# Patient Record
Sex: Male | Born: 1950 | Race: White | Hispanic: No | Marital: Married | Smoking: Never smoker
Health system: Southern US, Community
[De-identification: ages and names within clinical notes are randomized; demographics above are authoritative.]

## PROBLEM LIST (undated history)

## (undated) DIAGNOSIS — Z6841 Body Mass Index (BMI) 40.0 and over, adult: Secondary | ICD-10-CM

## (undated) DIAGNOSIS — I10 Essential (primary) hypertension: Secondary | ICD-10-CM

## (undated) DIAGNOSIS — I9789 Other postprocedural complications and disorders of the circulatory system, not elsewhere classified: Secondary | ICD-10-CM

## (undated) DIAGNOSIS — Z951 Presence of aortocoronary bypass graft: Secondary | ICD-10-CM

## (undated) DIAGNOSIS — E785 Hyperlipidemia, unspecified: Secondary | ICD-10-CM

## (undated) DIAGNOSIS — E119 Type 2 diabetes mellitus without complications: Secondary | ICD-10-CM

## (undated) DIAGNOSIS — I2510 Atherosclerotic heart disease of native coronary artery without angina pectoris: Secondary | ICD-10-CM

## (undated) DIAGNOSIS — I4891 Unspecified atrial fibrillation: Secondary | ICD-10-CM

## (undated) HISTORY — DX: Essential (primary) hypertension: I10

## (undated) HISTORY — DX: Body Mass Index (BMI) 40.0 and over, adult: Z684

## (undated) HISTORY — DX: Other postprocedural complications and disorders of the circulatory system, not elsewhere classified: I97.89

## (undated) HISTORY — DX: Atherosclerotic heart disease of native coronary artery without angina pectoris: I25.10

## (undated) HISTORY — DX: Unspecified atrial fibrillation: I48.91

## (undated) HISTORY — DX: Hyperlipidemia, unspecified: E78.5

## (undated) HISTORY — DX: Type 2 diabetes mellitus without complications: E11.9

## (undated) HISTORY — DX: Presence of aortocoronary bypass graft: Z95.1

## (undated) HISTORY — DX: Morbid (severe) obesity due to excess calories: E66.01

---

## 2007-06-13 ENCOUNTER — Emergency Department (HOSPITAL_COMMUNITY): Admission: EM | Admit: 2007-06-13 | Discharge: 2007-06-14 | Payer: Self-pay | Admitting: Emergency Medicine

## 2008-08-02 ENCOUNTER — Encounter: Admission: RE | Admit: 2008-08-02 | Discharge: 2008-09-21 | Payer: Self-pay | Admitting: Pain Medicine

## 2008-11-03 ENCOUNTER — Ambulatory Visit: Payer: Self-pay | Admitting: Vascular Surgery

## 2008-12-23 ENCOUNTER — Ambulatory Visit: Payer: Self-pay | Admitting: Vascular Surgery

## 2009-05-10 ENCOUNTER — Ambulatory Visit: Payer: Self-pay | Admitting: Vascular Surgery

## 2010-04-03 ENCOUNTER — Ambulatory Visit: Payer: Self-pay | Admitting: Vascular Surgery

## 2010-06-15 DIAGNOSIS — I2510 Atherosclerotic heart disease of native coronary artery without angina pectoris: Secondary | ICD-10-CM | POA: Insufficient documentation

## 2010-06-15 HISTORY — PX: CARDIAC CATHETERIZATION: SHX172

## 2010-06-26 ENCOUNTER — Ambulatory Visit
Admission: RE | Admit: 2010-06-26 | Discharge: 2010-06-26 | Disposition: A | Discharge status: Home / Self Care | Payer: BC Managed Care – PPO | Source: Ambulatory Visit | Attending: Cardiology | Admitting: Cardiology

## 2010-06-26 ENCOUNTER — Other Ambulatory Visit: Payer: Self-pay | Admitting: Cardiology

## 2010-06-26 DIAGNOSIS — I2510 Atherosclerotic heart disease of native coronary artery without angina pectoris: Secondary | ICD-10-CM

## 2010-06-26 HISTORY — DX: Atherosclerotic heart disease of native coronary artery without angina pectoris: I25.10

## 2010-06-30 ENCOUNTER — Ambulatory Visit (HOSPITAL_COMMUNITY)
Admission: RE | Admit: 2010-06-30 | Discharge: 2010-06-30 | Disposition: A | Discharge status: Home / Self Care | Payer: BC Managed Care – PPO | Source: Ambulatory Visit | Attending: Cardiology | Admitting: Cardiology

## 2010-06-30 ENCOUNTER — Other Ambulatory Visit: Payer: Self-pay | Admitting: Cardiothoracic Surgery

## 2010-06-30 DIAGNOSIS — E785 Hyperlipidemia, unspecified: Secondary | ICD-10-CM | POA: Insufficient documentation

## 2010-06-30 DIAGNOSIS — E119 Type 2 diabetes mellitus without complications: Secondary | ICD-10-CM | POA: Insufficient documentation

## 2010-06-30 DIAGNOSIS — I2510 Atherosclerotic heart disease of native coronary artery without angina pectoris: Secondary | ICD-10-CM

## 2010-06-30 DIAGNOSIS — E669 Obesity, unspecified: Secondary | ICD-10-CM | POA: Insufficient documentation

## 2010-06-30 DIAGNOSIS — Z01810 Encounter for preprocedural cardiovascular examination: Secondary | ICD-10-CM

## 2010-06-30 DIAGNOSIS — I10 Essential (primary) hypertension: Secondary | ICD-10-CM | POA: Insufficient documentation

## 2010-06-30 LAB — COMPREHENSIVE METABOLIC PANEL
ALT: 19 U/L (ref 0–53)
Alkaline Phosphatase: 57 U/L (ref 39–117)
Chloride: 110 mEq/L (ref 96–112)
Glucose, Bld: 144 mg/dL — ABNORMAL HIGH (ref 70–99)
Potassium: 3.9 mEq/L (ref 3.5–5.1)
Sodium: 139 mEq/L (ref 135–145)
Total Protein: 6.1 g/dL (ref 6.0–8.3)

## 2010-06-30 LAB — DIFFERENTIAL
Eosinophils Absolute: 0.2 10*3/uL (ref 0.0–0.7)
Lymphocytes Relative: 39 % (ref 12–46)
Lymphs Abs: 3.2 10*3/uL (ref 0.7–4.0)
Neutro Abs: 4 10*3/uL (ref 1.7–7.7)
Neutrophils Relative %: 48 % (ref 43–77)

## 2010-06-30 LAB — CBC
HCT: 37.3 % — ABNORMAL LOW (ref 39.0–52.0)
Hemoglobin: 12.6 g/dL — ABNORMAL LOW (ref 13.0–17.0)
MCV: 82.7 fL (ref 78.0–100.0)
Platelets: 209 10*3/uL (ref 150–400)
RBC: 4.51 MIL/uL (ref 4.22–5.81)
WBC: 8.4 10*3/uL (ref 4.0–10.5)

## 2010-06-30 LAB — LIPID PANEL
Cholesterol: 134 mg/dL (ref 0–200)
Total CHOL/HDL Ratio: 3.8 RATIO
VLDL: 11 mg/dL (ref 0–40)

## 2010-06-30 LAB — GLUCOSE, CAPILLARY: Glucose-Capillary: 129 mg/dL — ABNORMAL HIGH (ref 70–99)
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
## 2010-07-03 NOTE — Procedures (Signed)
NAME:  Carl Steele, Carl Steele NO.:  [id number]  MEDICAL RECORD NO.:  [id number]           PATIENT TYPE:  O  LOCATION:  [facility]                         FACILITY:  [facility]  PHYSICIAN:  Carl Corporal, MD DATE OF BIRTH:  06-06-1950  DATE OF PROCEDURE:  06/30/2010 DATE OF DISCHARGE:                           CARDIAC CATHETERIZATION   PRIMARY CARE PHYSICIAN:  Carl Fick, MD  PROCEDURES PERFORMED: 1. Left heart catheterization via right radial artery access with 5-     French sheath. 2. Left ventriculography from an RAO projection with 11 mL of contrast     per second for 30 seconds. 3. Native coronary angiography. 4. Left internal mammary artery angiography.  INDICATIONS: 1. Abnormal nuclear stress test. 2. Worsening exertional angina in a patient with multiple risk     factors.  BRIEF HISTORY:  Carl Steele is a very pleasant 60 year old gentleman with a history of hypertension, hyperlipidemia, obesity, and type 2 diabetes who was initially referred to me for postprandial chest pain episodes and was evaluated with a nuclear stress test which suggested possible mild inferior ischemia.  The initial plan was to treat this medically, however, the patient did then begin to note exertional chest pain when doing his normal activities that he did not notice before.  He then requested to be evaluated heart with the diagnostic heart catheterization.  The risks, benefits, alternatives, and indications of the procedure were explained to the patient in detail as he was referred for cardiac catheterization.  Informed consent was obtained with a signed form placed in the chart.  All questions were answered and the patient agreed to proceed.  PROCEDURE IN DETAIL:  The patient was brought to the second floor [place] cardiac catheterization lab in a fasting state.  A modified Allen/Barbeau test was performed with the right radial and ulnar arteries that demonstrated  excellent collateral flow to the ulnar artery with plethysmography.  After this was noted, the patient was prepped and draped in usual sterile fashion with the right radial access site exposed.  A time-out period was performed and the patient was sedated with intravenous Versed and fentanyl.  The right wrist was anesthetized using 1% subcutaneous lidocaine and the right radial artery was accessed using a Seldinger technique with placement of 5-French sheath.  Sheath was aspirated and flushed and infiltrated with a total of 10 mL of standard radial cocktail.  After that, a 5-French TIG 4.0 catheter was advanced over the wire into the ascending aorta.  It was then used to engage first the right and left coronary arteries and multiple angiographic views of the left and right coronary artery systems were obtained.  The catheter was then pulled back into the aortic arch and redirected to the left subclavian artery.  It was then advanced over the wire into the left subclavian artery.  With the wire out, then the catheter was directed into the left internal mammary artery and angiographic imaging of left internal mammary artery was obtained.  The catheter was then pulled back into the aortic arch and exchanged over the wire for a 5-French pigtail catheter which was then advanced down  into the ascending aorta and across the aortic valve for measurement of left ventricular hemodynamics.  The left ventriculogram was performed in the RAO projection with 11 mm of contrast for over 30 seconds.  After completion of the left ventriculogram, the left ventricular hemodynamics were then remeasured and the catheter was pulled back across the aortic valve measuring pullback gradient which was not noted.  The catheter was removed completely out of the body over the wire without any complications.  TR band was then removed in the cardiac catheterization lab at 8:45 in the morning with 15 mL of air  with adequate hemostasis and excellent reverse Allen test with plethysmography demonstrating patent hemostasis.  The patient was then transferred to the holding area for ongoing care.  The patient was stable before, during, and after the procedure with no complications. Estimated blood loss was less than 10 mL but I sent a 20 mL syringe of blood for likely preop lab work.  CATHETERIZATION HISTORY: 1. Sedation:  25 mcg IV fentanyl and 2 mg IV Versed. 2. Total of 5000 units of intravenous heparin was given at the time of     sheath in place. 3. Total of 150 mL of contrast was used. 4. Radial cocktail:  400 mcg of nitroglycerin, 5 mg of verapamil, 2 mL     of 1% lidocaine diluted in 10 mL of normal saline.  HEMODYNAMICS: 1. The left ventricular pressure was 116/12 mmHg and EDP of 15 mmHg. 2. Aortic pressure 107/70 mmHg.  ANGIOGRAPHIC FINDINGS: 1. The left main is a large-caliber vessel.  It basically trifurcates     into the LAD, circumflex, and ramus intermedius.  There is no     significant disease in the left main itself, it is a large trunk. 2. The LAD is a large-caliber vessel that gives off to a very very     large first diagonal branch that is free of any significant     disease.  This branch goes down all the way almost to the apex.     After this branch vessel, there are multiple septal perforators and     diffuse disease beginning with an 80% lesion just after the takeoff     and then with these successive septal perforators, there is at     least 50-60% lesions.  Just prior to the takeoff of second diagonal     branch, there is a roughly 60% lesion.  The second diagonal branch     itself has a 70% proximal lesion.  After the diagonal branch, the     LAD continues to be diffusely diseased without multiple septal     perforators and then has 100% occlusion what appears to be another     septal perforator or diagonal branch.  There is too inflow of flow     in the distal  portion suggestive of collateral flow. 3. The ramus intermedius is a large-caliber vessel.  There is diffuse     luminal irregularities proximally, but no significant stenoses.     This vessel reaches all the way down near toward the apex.  It     courses in a very proximal OM type distribution. 4. The circumflex artery has a very proximal diffuse 60-90 and at the     worst 95% occlusion at the takeoff of the small obtuse marginal     branch.  The remainder of this vessel courses down and it then     bifurcates into a  large posterolateral obtuse marginal branch with     diffuse luminal irregularities ranging from 50 and then 40%     lesions.  It courses down along the lateral wall.  The other branch     goes into the atrioventricular groove and appears to have a     proximal occlusion with some right-to-left filling from     collaterals. 5. The right coronary artery is a dominant vessel and has diffuse     proximal lesions ranging from two with 70, then diffuse 30-40     followed by another tubular 60 and then 70% lesions followed by an     80% just at the takeoff of a large acute marginal branch.  The     acute marginal branch actually courses down along the RV marginal     and gives rise to what appears to be collaterals to the distal LAD     and back-fills the LAD.  The remainder of the RCA has no     significant disease until some diffuse luminal irregularities in     the very distal portion terminating with a 60% lesion at the crux     before the bifurcation of posterolateral and posterior descending     arteries.  The posterior descending artery has a roughly 80-90%     proximal lesion and then this has significant septal perforator     collaterals going to the left anterior descending artery.  The     right posterolateral branch gives rise to collaterals via the what     appears to be the AV nodal artery and also some from the atrial     branch to the proximal circumflex as well  as then from the distal     portion of the distal posterolateral branch giving collaterals to     the distal AV groove circumflex. 6. With the catheter directed into the direct visualization of the     left internal mammary artery, the left internal mammary artery was     a very large-caliber vessel with no disease whatsoever, was not     tortuous, and it bifurcates into 2 large branches down at the     diaphragm with 2 small branches prior to that.  This will be an     excellent conduit for bypass. 7. Left ventriculogram performed in the RAO projection demonstrated     preserved ejection fraction of 55% with no significant wall motion     abnormalities.  IMPRESSION:  Severe multivessel native coronary artery disease, needs some CVTS consult.  SUMMARY: 1. Severe mid LAD disease ranging from 80% proximal all the way to     100% occlusion after diagonal 3 branch.  There is also 70% stenosis     in the second diagonal branch.  The LIMA is actually filled from     right acute marginal as well as septal collaterals from the right. 2. Severe proximal circumflex 60-90% stenosis with some back-filling     to right AV groove collaterals as well as atrial branches. 3. Severe proximal right coronary artery as well as right posterior     descending artery disease. 4. Preserved left ventricular function with brisk collateralization as     likely the reason for preserved function with no wall motion     abnormalities.  PLAN: 1. Standard post radial care.  The patient assumes like that he is     totally stable and can likely be discharged  tonight with plan to     follow up for elective bypass surgery. 2. I consulted Dr. Sheliah Plane from CVTS who will review the films     and see the patient prior to discharge today. 3. He will continue on the beta-blocker, statin, and aspirin and will     not to be put on any dihydropyridines.  I did call the patient's wife and discussed the case with her  as well as the patient.  They both understand the plan and Dr. Tyrone Sage intends to see the patient prior to discharge.          ______________________________ Carl Corporal, MD     DWH/MEDQ  D:  06/30/2010  T:  07/01/2010  Job:  [id number]  cc:   Carl Steele, M.D. Second Floor [facility] Sheliah Plane, MD  Electronically Signed by Bryan Lemma MD on 07/03/2010 09:24:42 AM

## 2010-07-05 ENCOUNTER — Other Ambulatory Visit: Payer: Self-pay | Admitting: Cardiothoracic Surgery

## 2010-07-05 ENCOUNTER — Encounter (HOSPITAL_COMMUNITY)
Admission: RE | Admit: 2010-07-05 | Discharge: 2010-07-05 | Disposition: A | Discharge status: Home / Self Care | Payer: BC Managed Care – PPO | Source: Ambulatory Visit | Attending: Cardiothoracic Surgery | Admitting: Cardiothoracic Surgery

## 2010-07-05 ENCOUNTER — Ambulatory Visit (HOSPITAL_COMMUNITY)
Admission: RE | Admit: 2010-07-05 | Discharge: 2010-07-05 | Disposition: A | Discharge status: Home / Self Care | Payer: BC Managed Care – PPO | Source: Ambulatory Visit | Attending: Cardiothoracic Surgery | Admitting: Cardiothoracic Surgery

## 2010-07-05 ENCOUNTER — Inpatient Hospital Stay (HOSPITAL_COMMUNITY)
Admission: RE | Admit: 2010-07-05 | Discharge: 2010-07-05 | Disposition: A | Discharge status: Home / Self Care | Payer: BC Managed Care – PPO | Source: Ambulatory Visit | Attending: Cardiothoracic Surgery | Admitting: Cardiothoracic Surgery

## 2010-07-05 DIAGNOSIS — Z01810 Encounter for preprocedural cardiovascular examination: Secondary | ICD-10-CM | POA: Insufficient documentation

## 2010-07-05 DIAGNOSIS — Z01811 Encounter for preprocedural respiratory examination: Secondary | ICD-10-CM

## 2010-07-05 DIAGNOSIS — Z01812 Encounter for preprocedural laboratory examination: Secondary | ICD-10-CM | POA: Insufficient documentation

## 2010-07-05 LAB — BLOOD GAS, ARTERIAL
Acid-Base Excess: 0.5 mmol/L (ref 0.0–2.0)
Bicarbonate: 24.4 mEq/L — ABNORMAL HIGH (ref 20.0–24.0)
Drawn by: 206361
FIO2: 0.21 %
O2 Saturation: 96.8 %
Patient temperature: 98.6
TCO2: 25.5 mmol/L (ref 0–100)
pCO2 arterial: 37.5 mmHg (ref 35.0–45.0)
pH, Arterial: 7.428 (ref 7.350–7.450)
pO2, Arterial: 84.3 mmHg (ref 80.0–100.0)

## 2010-07-05 LAB — CBC
HCT: 41.4 % (ref 39.0–52.0)
Hemoglobin: 14.2 g/dL (ref 13.0–17.0)
MCH: 28.7 pg (ref 26.0–34.0)
MCHC: 34.3 g/dL (ref 30.0–36.0)
MCV: 83.8 fL (ref 78.0–100.0)
Platelets: 226 10*3/uL (ref 150–400)
RBC: 4.94 MIL/uL (ref 4.22–5.81)
RDW: 14 % (ref 11.5–15.5)
WBC: 10.9 10*3/uL — ABNORMAL HIGH (ref 4.0–10.5)

## 2010-07-05 LAB — URINALYSIS, ROUTINE W REFLEX MICROSCOPIC
Bilirubin Urine: NEGATIVE
Glucose, UA: NEGATIVE mg/dL
Hgb urine dipstick: NEGATIVE
Ketones, ur: NEGATIVE mg/dL
Nitrite: NEGATIVE
Protein, ur: NEGATIVE mg/dL
Specific Gravity, Urine: 1.023 (ref 1.005–1.030)
Urobilinogen, UA: 1 mg/dL (ref 0.0–1.0)
pH: 6 (ref 5.0–8.0)

## 2010-07-05 LAB — APTT: aPTT: 24 seconds (ref 24–37)

## 2010-07-05 LAB — COMPREHENSIVE METABOLIC PANEL
ALT: 25 U/L (ref 0–53)
AST: 21 U/L (ref 0–37)
Albumin: 3.6 g/dL (ref 3.5–5.2)
Alkaline Phosphatase: 66 U/L (ref 39–117)
BUN: 22 mg/dL (ref 6–23)
CO2: 25 mEq/L (ref 19–32)
Calcium: 9 mg/dL (ref 8.4–10.5)
Chloride: 105 mEq/L (ref 96–112)
Creatinine, Ser: 1.32 mg/dL (ref 0.4–1.5)
GFR calc Af Amer: >60 mL/min (ref >60)
GFR calc non Af Amer: 56 mL/min — ABNORMAL LOW (ref >60)
Glucose, Bld: 107 mg/dL — ABNORMAL HIGH (ref 70–99)
Potassium: 4.4 mEq/L (ref 3.5–5.1)
Sodium: 137 mEq/L (ref 135–145)
Total Bilirubin: 0.7 mg/dL (ref 0.3–1.2)
Total Protein: 6.4 g/dL (ref 6.0–8.3)

## 2010-07-05 LAB — HEMOGLOBIN A1C
Hgb A1c MFr Bld: 7.1 % — ABNORMAL HIGH (ref <5.7)
Mean Plasma Glucose: 157 mg/dL — ABNORMAL HIGH (ref <117)

## 2010-07-05 LAB — ABO/RH: ABO/RH(D): B POS

## 2010-07-05 LAB — TYPE AND SCREEN
ABO/RH(D): B POS
Antibody Screen: NEGATIVE

## 2010-07-05 LAB — PROTIME-INR
INR: 1 (ref 0.00–1.49)
Prothrombin Time: 13.4 seconds (ref 11.6–15.2)

## 2010-07-05 LAB — SURGICAL PCR SCREEN
MRSA, PCR: NEGATIVE
Staphylococcus aureus: NEGATIVE

## 2010-07-07 ENCOUNTER — Inpatient Hospital Stay (HOSPITAL_COMMUNITY)
Admission: RE | Admit: 2010-07-07 | Discharge: 2010-07-16 | DRG: 109 | Disposition: A | Discharge status: Home / Self Care | Payer: BC Managed Care – PPO | Source: Ambulatory Visit | Attending: Cardiothoracic Surgery | Admitting: Cardiothoracic Surgery

## 2010-07-07 ENCOUNTER — Inpatient Hospital Stay (HOSPITAL_COMMUNITY): Payer: BC Managed Care – PPO

## 2010-07-07 DIAGNOSIS — I10 Essential (primary) hypertension: Secondary | ICD-10-CM | POA: Diagnosis present

## 2010-07-07 DIAGNOSIS — Z951 Presence of aortocoronary bypass graft: Secondary | ICD-10-CM

## 2010-07-07 DIAGNOSIS — Z7982 Long term (current) use of aspirin: Secondary | ICD-10-CM

## 2010-07-07 DIAGNOSIS — M549 Dorsalgia, unspecified: Secondary | ICD-10-CM | POA: Diagnosis present

## 2010-07-07 DIAGNOSIS — I4891 Unspecified atrial fibrillation: Secondary | ICD-10-CM | POA: Diagnosis not present

## 2010-07-07 DIAGNOSIS — I6529 Occlusion and stenosis of unspecified carotid artery: Secondary | ICD-10-CM | POA: Diagnosis present

## 2010-07-07 DIAGNOSIS — I209 Angina pectoris, unspecified: Secondary | ICD-10-CM | POA: Diagnosis present

## 2010-07-07 DIAGNOSIS — E7800 Pure hypercholesterolemia, unspecified: Secondary | ICD-10-CM | POA: Diagnosis present

## 2010-07-07 DIAGNOSIS — E876 Hypokalemia: Secondary | ICD-10-CM | POA: Diagnosis not present

## 2010-07-07 DIAGNOSIS — E669 Obesity, unspecified: Secondary | ICD-10-CM | POA: Diagnosis present

## 2010-07-07 DIAGNOSIS — I658 Occlusion and stenosis of other precerebral arteries: Secondary | ICD-10-CM | POA: Diagnosis present

## 2010-07-07 DIAGNOSIS — D62 Acute posthemorrhagic anemia: Secondary | ICD-10-CM | POA: Diagnosis not present

## 2010-07-07 DIAGNOSIS — K560 Paralytic ileus: Secondary | ICD-10-CM | POA: Diagnosis not present

## 2010-07-07 DIAGNOSIS — I2510 Atherosclerotic heart disease of native coronary artery without angina pectoris: Secondary | ICD-10-CM

## 2010-07-07 DIAGNOSIS — E785 Hyperlipidemia, unspecified: Secondary | ICD-10-CM | POA: Diagnosis present

## 2010-07-07 DIAGNOSIS — E8779 Other fluid overload: Secondary | ICD-10-CM | POA: Diagnosis not present

## 2010-07-07 DIAGNOSIS — E119 Type 2 diabetes mellitus without complications: Secondary | ICD-10-CM | POA: Diagnosis present

## 2010-07-07 HISTORY — PX: CORONARY ARTERY BYPASS GRAFT: SHX141

## 2010-07-07 HISTORY — DX: Presence of aortocoronary bypass graft: Z95.1

## 2010-07-07 LAB — CREATININE, SERUM
Creatinine, Ser: 1.02 mg/dL (ref 0.4–1.5)
GFR calc Af Amer: >60 mL/min (ref >60)
GFR calc non Af Amer: >60 mL/min (ref >60)

## 2010-07-07 LAB — POCT I-STAT 3, ART BLOOD GAS (G3+)
Acid-Base Excess: 1 mmol/L (ref 0.0–2.0)
Acid-base deficit: 2 mmol/L (ref 0.0–2.0)
Bicarbonate: 25.8 mEq/L — ABNORMAL HIGH (ref 20.0–24.0)
Bicarbonate: 22.6 mEq/L (ref 20.0–24.0)
O2 Saturation: 100 %
O2 Saturation: 97 %
Patient temperature: 32
Patient temperature: 36.7
TCO2: 27 mmol/L (ref 0–100)
TCO2: 24 mmol/L (ref 0–100)
pCO2 arterial: 33.4 mmHg — ABNORMAL LOW (ref 35.0–45.0)
pCO2 arterial: 37.5 mmHg (ref 35.0–45.0)
pH, Arterial: 7.473 — ABNORMAL HIGH (ref 7.350–7.450)
pH, Arterial: 7.388 (ref 7.350–7.450)
pO2, Arterial: 273 mmHg — ABNORMAL HIGH (ref 80.0–100.0)
pO2, Arterial: 95 mmHg (ref 80.0–100.0)

## 2010-07-07 LAB — POCT I-STAT 4, (NA,K, GLUC, HGB,HCT)
Glucose, Bld: 119 mg/dL — ABNORMAL HIGH (ref 70–99)
Glucose, Bld: 139 mg/dL — ABNORMAL HIGH (ref 70–99)
Glucose, Bld: 138 mg/dL — ABNORMAL HIGH (ref 70–99)
Glucose, Bld: 141 mg/dL — ABNORMAL HIGH (ref 70–99)
Glucose, Bld: 170 mg/dL — ABNORMAL HIGH (ref 70–99)
HCT: 37 % — ABNORMAL LOW (ref 39.0–52.0)
HCT: 33 % — ABNORMAL LOW (ref 39.0–52.0)
HCT: 30 % — ABNORMAL LOW (ref 39.0–52.0)
HCT: 28 % — ABNORMAL LOW (ref 39.0–52.0)
HCT: 28 % — ABNORMAL LOW (ref 39.0–52.0)
Hemoglobin: 12.6 g/dL — ABNORMAL LOW (ref 13.0–17.0)
Hemoglobin: 11.2 g/dL — ABNORMAL LOW (ref 13.0–17.0)
Hemoglobin: 10.2 g/dL — ABNORMAL LOW (ref 13.0–17.0)
Hemoglobin: 9.5 g/dL — ABNORMAL LOW (ref 13.0–17.0)
Hemoglobin: 9.5 g/dL — ABNORMAL LOW (ref 13.0–17.0)
Potassium: 3.6 mEq/L (ref 3.5–5.1)
Potassium: 3.9 mEq/L (ref 3.5–5.1)
Potassium: 4.7 mEq/L (ref 3.5–5.1)
Potassium: 5.1 mEq/L (ref 3.5–5.1)
Potassium: 3.3 mEq/L — ABNORMAL LOW (ref 3.5–5.1)
Sodium: 141 mEq/L (ref 135–145)
Sodium: 140 mEq/L (ref 135–145)
Sodium: 134 mEq/L — ABNORMAL LOW (ref 135–145)
Sodium: 135 mEq/L (ref 135–145)
Sodium: 141 mEq/L (ref 135–145)

## 2010-07-07 LAB — POCT I-STAT 3, VENOUS BLOOD GAS (G3P V)
Acid-base deficit: 3 mmol/L — ABNORMAL HIGH (ref 0.0–2.0)
Bicarbonate: 22.6 mEq/L (ref 20.0–24.0)
O2 Saturation: 81 %
Patient temperature: 32.5
TCO2: 24 mmol/L (ref 0–100)
pCO2, Ven: 35.6 mmHg — ABNORMAL LOW (ref 45.0–50.0)
pH, Ven: 7.389 — ABNORMAL HIGH (ref 7.250–7.300)
pO2, Ven: 35 mmHg (ref 30.0–45.0)

## 2010-07-07 LAB — PROTIME-INR
INR: 1.53 — ABNORMAL HIGH (ref 0.00–1.49)
Prothrombin Time: 18.6 seconds — ABNORMAL HIGH (ref 11.6–15.2)

## 2010-07-07 LAB — CBC
HCT: 29.7 % — ABNORMAL LOW (ref 39.0–52.0)
HCT: 29.5 % — ABNORMAL LOW (ref 39.0–52.0)
Hemoglobin: 10.2 g/dL — ABNORMAL LOW (ref 13.0–17.0)
Hemoglobin: 10 g/dL — ABNORMAL LOW (ref 13.0–17.0)
MCH: 28.6 pg (ref 26.0–34.0)
MCH: 28.2 pg (ref 26.0–34.0)
MCHC: 34.3 g/dL (ref 30.0–36.0)
MCHC: 33.9 g/dL (ref 30.0–36.0)
MCV: 83.2 fL (ref 78.0–100.0)
MCV: 83.3 fL (ref 78.0–100.0)
Platelets: 140 10*3/uL — ABNORMAL LOW (ref 150–400)
Platelets: 171 10*3/uL (ref 150–400)
RBC: 3.57 MIL/uL — ABNORMAL LOW (ref 4.22–5.81)
RBC: 3.54 MIL/uL — ABNORMAL LOW (ref 4.22–5.81)
RDW: 13.7 % (ref 11.5–15.5)
RDW: 13.9 % (ref 11.5–15.5)
WBC: 12.1 10*3/uL — ABNORMAL HIGH (ref 4.0–10.5)
WBC: 14.4 10*3/uL — ABNORMAL HIGH (ref 4.0–10.5)

## 2010-07-07 LAB — APTT: aPTT: 29 seconds (ref 24–37)

## 2010-07-07 LAB — GLUCOSE, CAPILLARY
Glucose-Capillary: 123 mg/dL — ABNORMAL HIGH (ref 70–99)
Glucose-Capillary: 99 mg/dL (ref 70–99)
Glucose-Capillary: 125 mg/dL — ABNORMAL HIGH (ref 70–99)
Glucose-Capillary: 130 mg/dL — ABNORMAL HIGH (ref 70–99)
Glucose-Capillary: 106 mg/dL — ABNORMAL HIGH (ref 70–99)
Glucose-Capillary: 117 mg/dL — ABNORMAL HIGH (ref 70–99)

## 2010-07-07 LAB — POCT I-STAT GLUCOSE
Glucose, Bld: 143 mg/dL — ABNORMAL HIGH (ref 70–99)
Operator id: 3390

## 2010-07-07 LAB — PLATELET COUNT: Platelets: 159 10*3/uL (ref 150–400)

## 2010-07-07 LAB — MAGNESIUM: Magnesium: 2.7 mg/dL — ABNORMAL HIGH (ref 1.5–2.5)

## 2010-07-07 LAB — HEMOGLOBIN AND HEMATOCRIT, BLOOD
HCT: 27.3 % — ABNORMAL LOW (ref 39.0–52.0)
Hemoglobin: 9.2 g/dL — ABNORMAL LOW (ref 13.0–17.0)

## 2010-07-08 ENCOUNTER — Inpatient Hospital Stay (HOSPITAL_COMMUNITY): Payer: BC Managed Care – PPO

## 2010-07-08 LAB — CBC
HCT: 25.1 % — ABNORMAL LOW (ref 39.0–52.0)
HCT: 26.7 % — ABNORMAL LOW (ref 39.0–52.0)
Hemoglobin: 8.4 g/dL — ABNORMAL LOW (ref 13.0–17.0)
Hemoglobin: 8.9 g/dL — ABNORMAL LOW (ref 13.0–17.0)
MCH: 28 pg (ref 26.0–34.0)
MCH: 28.1 pg (ref 26.0–34.0)
MCHC: 33.5 g/dL (ref 30.0–36.0)
MCHC: 33.3 g/dL (ref 30.0–36.0)
MCV: 83.7 fL (ref 78.0–100.0)
MCV: 84.2 fL (ref 78.0–100.0)
Platelets: 149 10*3/uL — ABNORMAL LOW (ref 150–400)
Platelets: 157 10*3/uL (ref 150–400)
RBC: 3 MIL/uL — ABNORMAL LOW (ref 4.22–5.81)
RBC: 3.17 MIL/uL — ABNORMAL LOW (ref 4.22–5.81)
RDW: 14.1 % (ref 11.5–15.5)
RDW: 14.3 % (ref 11.5–15.5)
WBC: 11.2 10*3/uL — ABNORMAL HIGH (ref 4.0–10.5)
WBC: 15.7 10*3/uL — ABNORMAL HIGH (ref 4.0–10.5)

## 2010-07-08 LAB — BASIC METABOLIC PANEL
BUN: 9 mg/dL (ref 6–23)
CO2: 24 mEq/L (ref 19–32)
Calcium: 7.2 mg/dL — ABNORMAL LOW (ref 8.4–10.5)
Chloride: 109 mEq/L (ref 96–112)
Creatinine, Ser: 0.96 mg/dL (ref 0.4–1.5)
GFR calc Af Amer: >60 mL/min (ref >60)
GFR calc non Af Amer: >60 mL/min (ref >60)
Glucose, Bld: 160 mg/dL — ABNORMAL HIGH (ref 70–99)
Potassium: 4 mEq/L (ref 3.5–5.1)
Sodium: 136 mEq/L (ref 135–145)

## 2010-07-08 LAB — CK TOTAL AND CKMB (NOT AT ARMC)
CK, MB: 7.6 ng/mL (ref 0.3–4.0)
Relative Index: 1.3 (ref 0.0–2.5)
Total CK: 606 U/L — ABNORMAL HIGH (ref 7–232)

## 2010-07-08 LAB — GLUCOSE, CAPILLARY
Glucose-Capillary: 104 mg/dL — ABNORMAL HIGH (ref 70–99)
Glucose-Capillary: 142 mg/dL — ABNORMAL HIGH (ref 70–99)
Glucose-Capillary: 149 mg/dL — ABNORMAL HIGH (ref 70–99)
Glucose-Capillary: 153 mg/dL — ABNORMAL HIGH (ref 70–99)
Glucose-Capillary: 154 mg/dL — ABNORMAL HIGH (ref 70–99)
Glucose-Capillary: 168 mg/dL — ABNORMAL HIGH (ref 70–99)

## 2010-07-08 LAB — MAGNESIUM
Magnesium: 2 mg/dL (ref 1.5–2.5)
Magnesium: 1.9 mg/dL (ref 1.5–2.5)

## 2010-07-08 LAB — CREATININE, SERUM
Creatinine, Ser: 1.03 mg/dL (ref 0.4–1.5)
GFR calc Af Amer: >60 mL/min (ref >60)
GFR calc non Af Amer: >60 mL/min (ref >60)

## 2010-07-09 ENCOUNTER — Inpatient Hospital Stay (HOSPITAL_COMMUNITY): Payer: BC Managed Care – PPO

## 2010-07-09 LAB — CBC
MCH: 27.8 pg (ref 26.0–34.0)
Platelets: 155 10*3/uL (ref 150–400)
RBC: 3.24 MIL/uL — ABNORMAL LOW (ref 4.22–5.81)
WBC: 16 10*3/uL — ABNORMAL HIGH (ref 4.0–10.5)

## 2010-07-09 LAB — BASIC METABOLIC PANEL
Chloride: 99 mEq/L (ref 96–112)
Creatinine, Ser: 1 mg/dL (ref 0.4–1.5)
GFR calc Af Amer: >60 mL/min (ref >60)
GFR calc non Af Amer: >60 mL/min (ref >60)

## 2010-07-09 LAB — GLUCOSE, CAPILLARY
Glucose-Capillary: 133 mg/dL — ABNORMAL HIGH (ref 70–99)
Glucose-Capillary: 134 mg/dL — ABNORMAL HIGH (ref 70–99)
Glucose-Capillary: 136 mg/dL — ABNORMAL HIGH (ref 70–99)
Glucose-Capillary: 144 mg/dL — ABNORMAL HIGH (ref 70–99)
Glucose-Capillary: 147 mg/dL — ABNORMAL HIGH (ref 70–99)
Glucose-Capillary: 152 mg/dL — ABNORMAL HIGH (ref 70–99)
Glucose-Capillary: 185 mg/dL — ABNORMAL HIGH (ref 70–99)

## 2010-07-10 ENCOUNTER — Inpatient Hospital Stay (HOSPITAL_COMMUNITY): Payer: BC Managed Care – PPO

## 2010-07-10 LAB — POCT I-STAT 3, ART BLOOD GAS (G3+)
Acid-Base Excess: 1 mmol/L (ref 0.0–2.0)
Bicarbonate: 25.1 mEq/L — ABNORMAL HIGH (ref 20.0–24.0)
Bicarbonate: 25 mEq/L — ABNORMAL HIGH (ref 20.0–24.0)
O2 Saturation: 97 %
O2 Saturation: 95 %
Patient temperature: 35.8
Patient temperature: 36.8
TCO2: 26 mmol/L (ref 0–100)
TCO2: 26 mmol/L (ref 0–100)
pCO2 arterial: 33.4 mmHg — ABNORMAL LOW (ref 35.0–45.0)
pCO2 arterial: 38.8 mmHg (ref 35.0–45.0)
pH, Arterial: 7.481 — ABNORMAL HIGH (ref 7.350–7.450)
pH, Arterial: 7.416 (ref 7.350–7.450)
pO2, Arterial: 77 mmHg — ABNORMAL LOW (ref 80.0–100.0)
pO2, Arterial: 72 mmHg — ABNORMAL LOW (ref 80.0–100.0)

## 2010-07-10 LAB — GLUCOSE, CAPILLARY
Glucose-Capillary: 121 mg/dL — ABNORMAL HIGH (ref 70–99)
Glucose-Capillary: 106 mg/dL — ABNORMAL HIGH (ref 70–99)
Glucose-Capillary: 128 mg/dL — ABNORMAL HIGH (ref 70–99)
Glucose-Capillary: 142 mg/dL — ABNORMAL HIGH (ref 70–99)
Glucose-Capillary: 135 mg/dL — ABNORMAL HIGH (ref 70–99)
Glucose-Capillary: 130 mg/dL — ABNORMAL HIGH (ref 70–99)

## 2010-07-10 LAB — POCT I-STAT, CHEM 8
BUN: 13 mg/dL (ref 6–23)
BUN: 9 mg/dL (ref 6–23)
Calcium, Ion: 1.08 mmol/L — ABNORMAL LOW (ref 1.12–1.32)
Calcium, Ion: 0.88 mmol/L — ABNORMAL LOW (ref 1.12–1.32)
Chloride: 105 mEq/L (ref 96–112)
Chloride: 98 mEq/L (ref 96–112)
Creatinine, Ser: 1 mg/dL (ref 0.4–1.5)
Creatinine, Ser: 1.2 mg/dL (ref 0.4–1.5)
Glucose, Bld: 117 mg/dL — ABNORMAL HIGH (ref 70–99)
Glucose, Bld: 154 mg/dL — ABNORMAL HIGH (ref 70–99)
HCT: 28 % — ABNORMAL LOW (ref 39.0–52.0)
HCT: 26 % — ABNORMAL LOW (ref 39.0–52.0)
Hemoglobin: 9.5 g/dL — ABNORMAL LOW (ref 13.0–17.0)
Hemoglobin: 8.8 g/dL — ABNORMAL LOW (ref 13.0–17.0)
Potassium: 3.7 mEq/L (ref 3.5–5.1)
Potassium: 4.9 mEq/L (ref 3.5–5.1)
Sodium: 140 mEq/L (ref 135–145)
Sodium: 133 mEq/L — ABNORMAL LOW (ref 135–145)
TCO2: 22 mmol/L (ref 0–100)
TCO2: 24 mmol/L (ref 0–100)

## 2010-07-10 LAB — POCT I-STAT 4, (NA,K, GLUC, HGB,HCT)
Glucose, Bld: 159 mg/dL — ABNORMAL HIGH (ref 70–99)
HCT: 44 % (ref 39.0–52.0)
Hemoglobin: 15 g/dL (ref 13.0–17.0)
Potassium: 3.1 mEq/L — ABNORMAL LOW (ref 3.5–5.1)
Sodium: 142 mEq/L (ref 135–145)

## 2010-07-10 LAB — CBC
MCH: 28.4 pg (ref 26.0–34.0)
MCHC: 33.7 g/dL (ref 30.0–36.0)
Platelets: 164 10*3/uL (ref 150–400)
RDW: 14.4 % (ref 11.5–15.5)

## 2010-07-10 LAB — BASIC METABOLIC PANEL
Calcium: 7.4 mg/dL — ABNORMAL LOW (ref 8.4–10.5)
GFR calc Af Amer: >60 mL/min (ref >60)
GFR calc non Af Amer: >60 mL/min (ref >60)
Sodium: 129 mEq/L — ABNORMAL LOW (ref 135–145)

## 2010-07-11 ENCOUNTER — Inpatient Hospital Stay (HOSPITAL_COMMUNITY): Payer: BC Managed Care – PPO

## 2010-07-11 LAB — BASIC METABOLIC PANEL
BUN: 13 mg/dL (ref 6–23)
CO2: 27 mEq/L (ref 19–32)
Calcium: 7.8 mg/dL — ABNORMAL LOW (ref 8.4–10.5)
Chloride: 102 mEq/L (ref 96–112)
Creatinine, Ser: 0.94 mg/dL (ref 0.4–1.5)
GFR calc Af Amer: >60 mL/min (ref >60)
GFR calc non Af Amer: >60 mL/min (ref >60)
Glucose, Bld: 99 mg/dL (ref 70–99)
Potassium: 3.6 mEq/L (ref 3.5–5.1)
Sodium: 135 mEq/L (ref 135–145)

## 2010-07-11 LAB — GLUCOSE, CAPILLARY
Glucose-Capillary: 107 mg/dL — ABNORMAL HIGH (ref 70–99)
Glucose-Capillary: 108 mg/dL — ABNORMAL HIGH (ref 70–99)
Glucose-Capillary: 121 mg/dL — ABNORMAL HIGH (ref 70–99)
Glucose-Capillary: 125 mg/dL — ABNORMAL HIGH (ref 70–99)
Glucose-Capillary: 140 mg/dL — ABNORMAL HIGH (ref 70–99)
Glucose-Capillary: 79 mg/dL (ref 70–99)

## 2010-07-11 LAB — PROTIME-INR
INR: 1.17 (ref 0.00–1.49)
Prothrombin Time: 15.1 seconds (ref 11.6–15.2)

## 2010-07-11 LAB — CBC
HCT: 26.6 % — ABNORMAL LOW (ref 39.0–52.0)
Hemoglobin: 8.9 g/dL — ABNORMAL LOW (ref 13.0–17.0)
MCH: 28.3 pg (ref 26.0–34.0)
MCHC: 33.5 g/dL (ref 30.0–36.0)
MCV: 84.4 fL (ref 78.0–100.0)
Platelets: 238 10*3/uL (ref 150–400)
RBC: 3.15 MIL/uL — ABNORMAL LOW (ref 4.22–5.81)
RDW: 14.5 % (ref 11.5–15.5)
WBC: 13.4 10*3/uL — ABNORMAL HIGH (ref 4.0–10.5)

## 2010-07-11 LAB — POCT I-STAT, CHEM 8
BUN: 5 mg/dL — ABNORMAL LOW (ref 6–23)
Calcium, Ion: 1.01 mmol/L — ABNORMAL LOW (ref 1.12–1.32)
Chloride: 88 mEq/L — ABNORMAL LOW (ref 96–112)
Creatinine, Ser: 9 mg/dL — ABNORMAL HIGH (ref 0.4–1.5)
Glucose, Bld: 568 mg/dL (ref 70–99)
HCT: 16 % — ABNORMAL LOW (ref 39.0–52.0)
Hemoglobin: 5.4 g/dL — CL (ref 13.0–17.0)
Potassium: 3.5 mEq/L (ref 3.5–5.1)
Sodium: 124 mEq/L — ABNORMAL LOW (ref 135–145)
TCO2: 14 mmol/L (ref 0–100)

## 2010-07-12 ENCOUNTER — Inpatient Hospital Stay (HOSPITAL_COMMUNITY): Payer: BC Managed Care – PPO

## 2010-07-12 LAB — GLUCOSE, CAPILLARY
Glucose-Capillary: 108 mg/dL — ABNORMAL HIGH (ref 70–99)
Glucose-Capillary: 120 mg/dL — ABNORMAL HIGH (ref 70–99)
Glucose-Capillary: 107 mg/dL — ABNORMAL HIGH (ref 70–99)
Glucose-Capillary: 99 mg/dL (ref 70–99)

## 2010-07-12 LAB — BASIC METABOLIC PANEL
BUN: 18 mg/dL (ref 6–23)
BUN: 18 mg/dL (ref 6–23)
CO2: 28 mEq/L (ref 19–32)
CO2: 26 mEq/L (ref 19–32)
Calcium: 7.8 mg/dL — ABNORMAL LOW (ref 8.4–10.5)
Calcium: 7.7 mg/dL — ABNORMAL LOW (ref 8.4–10.5)
Chloride: 103 mEq/L (ref 96–112)
Chloride: 102 mEq/L (ref 96–112)
Creatinine, Ser: 1.01 mg/dL (ref 0.4–1.5)
Creatinine, Ser: 1.04 mg/dL (ref 0.4–1.5)
GFR calc Af Amer: >60 mL/min (ref >60)
GFR calc Af Amer: >60 mL/min (ref >60)
GFR calc non Af Amer: >60 mL/min (ref >60)
GFR calc non Af Amer: >60 mL/min (ref >60)
Glucose, Bld: 110 mg/dL — ABNORMAL HIGH (ref 70–99)
Glucose, Bld: 127 mg/dL — ABNORMAL HIGH (ref 70–99)
Potassium: 3.3 mEq/L — ABNORMAL LOW (ref 3.5–5.1)
Potassium: 3.3 mEq/L — ABNORMAL LOW (ref 3.5–5.1)
Sodium: 136 mEq/L (ref 135–145)
Sodium: 137 mEq/L (ref 135–145)

## 2010-07-12 LAB — BRAIN NATRIURETIC PEPTIDE: Pro B Natriuretic peptide (BNP): 231 pg/mL — ABNORMAL HIGH (ref 0.0–100.0)

## 2010-07-12 LAB — CBC
HCT: 28.3 % — ABNORMAL LOW (ref 39.0–52.0)
Hemoglobin: 9.4 g/dL — ABNORMAL LOW (ref 13.0–17.0)
MCH: 27.9 pg (ref 26.0–34.0)
MCHC: 33.2 g/dL (ref 30.0–36.0)
MCV: 84 fL (ref 78.0–100.0)
Platelets: 314 10*3/uL (ref 150–400)
RBC: 3.37 MIL/uL — ABNORMAL LOW (ref 4.22–5.81)
RDW: 14.6 % (ref 11.5–15.5)
WBC: 11.9 10*3/uL — ABNORMAL HIGH (ref 4.0–10.5)

## 2010-07-12 LAB — PROTIME-INR
INR: 1.14 (ref 0.00–1.49)
Prothrombin Time: 14.8 seconds (ref 11.6–15.2)

## 2010-07-12 LAB — MAGNESIUM: Magnesium: 2.2 mg/dL (ref 1.5–2.5)

## 2010-07-13 ENCOUNTER — Inpatient Hospital Stay (HOSPITAL_COMMUNITY): Payer: BC Managed Care – PPO

## 2010-07-13 LAB — PROTIME-INR
INR: 1.4 (ref 0.00–1.49)
Prothrombin Time: 17.4 seconds — ABNORMAL HIGH (ref 11.6–15.2)

## 2010-07-13 LAB — BASIC METABOLIC PANEL
BUN: 19 mg/dL (ref 6–23)
CO2: 28 mEq/L (ref 19–32)
Calcium: 7.9 mg/dL — ABNORMAL LOW (ref 8.4–10.5)
Chloride: 101 mEq/L (ref 96–112)
Creatinine, Ser: 1.02 mg/dL (ref 0.4–1.5)
GFR calc Af Amer: >60 mL/min (ref >60)
GFR calc non Af Amer: >60 mL/min (ref >60)
Glucose, Bld: 81 mg/dL (ref 70–99)
Potassium: 2.8 mEq/L — ABNORMAL LOW (ref 3.5–5.1)
Sodium: 141 mEq/L (ref 135–145)

## 2010-07-13 LAB — CLOSTRIDIUM DIFFICILE BY PCR: Toxigenic C. Difficile by PCR: NEGATIVE

## 2010-07-13 LAB — GLUCOSE, CAPILLARY
Glucose-Capillary: 83 mg/dL (ref 70–99)
Glucose-Capillary: 142 mg/dL — ABNORMAL HIGH (ref 70–99)
Glucose-Capillary: 92 mg/dL (ref 70–99)
Glucose-Capillary: 113 mg/dL — ABNORMAL HIGH (ref 70–99)

## 2010-07-13 LAB — CBC
HCT: 25.2 % — ABNORMAL LOW (ref 39.0–52.0)
Hemoglobin: 8.5 g/dL — ABNORMAL LOW (ref 13.0–17.0)
MCH: 28.3 pg (ref 26.0–34.0)
MCHC: 33.7 g/dL (ref 30.0–36.0)
MCV: 84 fL (ref 78.0–100.0)
Platelets: 325 10*3/uL (ref 150–400)
RBC: 3 MIL/uL — ABNORMAL LOW (ref 4.22–5.81)
RDW: 14.7 % (ref 11.5–15.5)
WBC: 10.8 10*3/uL — ABNORMAL HIGH (ref 4.0–10.5)

## 2010-07-13 NOTE — Op Note (Signed)
NAME:  Carl Steele, Carl Steele NO.:  [id number]  MEDICAL RECORD NO.:  [id number]           PATIENT TYPE:  I  LOCATION:  2313                         FACILITY:  [facility]  PHYSICIAN:  Sheliah Plane, MD    DATE OF BIRTH:  June 02, 1950  DATE OF PROCEDURE:  07/07/2010 DATE OF DISCHARGE:                              OPERATIVE REPORT   PREOPERATIVE DIAGNOSIS:  Coronary occlusive disease.  POSTOPERATIVE DIAGNOSIS:  Coronary occlusive disease.  SURGICAL PROCEDURES:  Coronary artery bypass grafting x4 with left internal mammary to the left anterior descending coronary artery, reverse saphenous vein graft to the second diagonal, reverse saphenous vein graft to the distal circumflex, reverse saphenous vein graft to the posterior descending coronary artery with right thigh and calf endovein harvesting and open harvesting of saphenous vein from the left lower extremity.  SURGEON:  Sheliah Plane, MD  FIRST ASSISTANT:  Rowe Clack, PA-C  BRIEF HISTORY:  The patient is a 60 year old male with numerous risk factors for coronary artery disease and having episodes of anginal pain in January.  Initial medical therapy was started at the patient's request, however, he continued to have symptoms and agreed to further evaluation.  Cardiac catheterization was done that showed subtotal occlusion of his LAD with collateral filling from acute marginal branch. In addition, he had a high-grade posterior descending obstruction, a high-grade second diagonal obstruction, a large ramus that was without significant obstruction and the distal circumflex with 80% stenosis. Overall, the ventricular function was preserved.  Because of the patient's significant three-vessel disease, coronary artery bypass grafting was recommended.  The patient agreed and signed informed consent.  DESCRIPTION OF PROCEDURE:  With Swan-Ganz and arterial line monitors in place, the patient underwent general endotracheal  anesthesia without incidence.  Skin of the chest and leg was prepped with Betadine and draped in usual sterile manner.  Using the [employer] endovein harvesting system, vein was harvested from the right thigh and calf, only one segment was obtained from the thigh because at the higher end the vein was very bulbous and not usable.  Attempt in the left leg to harvest the vein endoscopically was unsuccessful.  The vein could not be adequately located.  For this reason, the third segment of the vein was harvested from the left lower leg open.  This vein was of good quality and caliber.  Median sternotomy was performed.  The left internal mammary artery was dissected down as pedicle graft.  The distal artery was divided, had good free flow.  The vessel was hydrostatically dilated with heparinized saline.  Pericardium was opened.  Overall ventricular function appeared preserved.  The patient was systemically heparinized. Because of his large surface area of 2.62 with a weight of 147 kg, a 22- [place] arterial cannula was placed.  The right atrium was cannulated and aortic root vent cardioplegia needle was introduced into the ascending aorta.  The patient was placed on cardiopulmonary bypass 2.4 liters per minute per meter square.  Sites of anastomosis were selected and dissected out of the epicardium.  The patient's body temperature was cooled to 30 degrees.  Aortic crossclamp was applied.  500  mL cold blood potassium cardioplegia was administered with diastolic arrest of the heart.  Myocardial septal temperatures were monitored throughout the crossclamp.  Attention was turned first to the posterior descending coronary artery which was opened between the proximal and mid third of the vessel.  The vessel was of good size admitting a 1.5-mm probe easily.  Vessel was 1.6 mm in size.  Using a running 7-0 Prolene, the distal anastomosis was performed with second reverse saphenous vein graft.  The heart  was then elevated.  Attention was turned to the distal circumflex which was opened and was slightly smaller but admitted a 1.5- mm probe.  Using a running 7-0 Prolene, distal anastomosis was performed.  Attention was then turned to the second diagonal coronary artery which was opened and it was relatively small 1.3-1.4 mm in size, using a running 7-0 Prolene distal anastomosis was performed.  Attention was then turned to the distal LAD which was opened and was 1.5-mm in size distally, using a running 8-0 Prolene, the left internal mammary artery was anastomosed to the left anterior descending coronary artery. With release of the bulldog on the mammary artery, there was rise in myocardial septal temperature.  The bulldog was placed back on the mammary artery with crossclamp still in place.  Three punch aortotomies were performed in the ascending aorta.  Each of the three vein grafts were anastomosed to the ascending aorta.  Air was evacuated from the grafts.  With removal of bulldog on the mammary artery there was rise in myocardial septal temperature.  The proximal anastomoses were completed and the crossclamp was removed, crossclamp time 94 minutes.  Sites of anastomosis were inspected free of bleeding.  The patient was then ventilated and weaned from cardiopulmonary bypass without difficulty. He remained hemodynamically stable.  He was decannulated in usual fashion.  Protamine sulfate was administered.  With operative field hemostatic, a left pleural tube and a Blake mediastinal drain were left in place.  Sternum was closed with #6 stainless steel wire after loosely reapproximating the pericardium.  Fascia was closed with interrupted 0 Vicryl, running 3-0 Vicryl in subcutaneous tissue, 4-0 subcuticular stitch in skin edges.  The left lower leg was closed with running 3-0 Vicryl in the subcutaneous tissue and skin staples in the skin edge. Dry dressings were applied.  The sponge count was  reported as correct. The small 8-0 needle count was incorrect.  X-rays were obtained in the operating room but because of his extremely large size, these needles were not seen on x-ray.  The patient was transferred to the SICU for further postoperative care without obvious complications.     Sheliah Plane, MD     EG/MEDQ  D:  07/10/2010  T:  07/10/2010  Job:  [id number]  cc:   Landry Corporal, MD  Electronically Signed by Sheliah Plane MD on 07/13/2010 03:43:50 PM

## 2010-07-14 ENCOUNTER — Inpatient Hospital Stay (HOSPITAL_COMMUNITY): Payer: BC Managed Care – PPO

## 2010-07-14 LAB — BASIC METABOLIC PANEL
BUN: 22 mg/dL (ref 6–23)
CO2: 27 mEq/L (ref 19–32)
Calcium: 8.1 mg/dL — ABNORMAL LOW (ref 8.4–10.5)
Chloride: 102 mEq/L (ref 96–112)
Creatinine, Ser: 1.09 mg/dL (ref 0.4–1.5)
GFR calc Af Amer: >60 mL/min (ref >60)
GFR calc non Af Amer: >60 mL/min (ref >60)
Glucose, Bld: 100 mg/dL — ABNORMAL HIGH (ref 70–99)
Potassium: 3.7 mEq/L (ref 3.5–5.1)
Sodium: 140 mEq/L (ref 135–145)

## 2010-07-14 LAB — GLUCOSE, CAPILLARY
Glucose-Capillary: 96 mg/dL (ref 70–99)
Glucose-Capillary: 130 mg/dL — ABNORMAL HIGH (ref 70–99)
Glucose-Capillary: 107 mg/dL — ABNORMAL HIGH (ref 70–99)
Glucose-Capillary: 106 mg/dL — ABNORMAL HIGH (ref 70–99)

## 2010-07-14 LAB — CBC
HCT: 25.9 % — ABNORMAL LOW (ref 39.0–52.0)
Hemoglobin: 8.5 g/dL — ABNORMAL LOW (ref 13.0–17.0)
MCH: 27.7 pg (ref 26.0–34.0)
MCHC: 32.8 g/dL (ref 30.0–36.0)
MCV: 84.4 fL (ref 78.0–100.0)
Platelets: 358 10*3/uL (ref 150–400)
RBC: 3.07 MIL/uL — ABNORMAL LOW (ref 4.22–5.81)
RDW: 15.1 % (ref 11.5–15.5)
WBC: 10.8 10*3/uL — ABNORMAL HIGH (ref 4.0–10.5)

## 2010-07-14 LAB — PROTIME-INR
INR: 2.34 — ABNORMAL HIGH (ref 0.00–1.49)
Prothrombin Time: 25.8 seconds — ABNORMAL HIGH (ref 11.6–15.2)

## 2010-07-15 ENCOUNTER — Inpatient Hospital Stay (HOSPITAL_COMMUNITY): Payer: BC Managed Care – PPO

## 2010-07-15 LAB — CBC
HCT: 28.1 % — ABNORMAL LOW (ref 39.0–52.0)
Hemoglobin: 9.3 g/dL — ABNORMAL LOW (ref 13.0–17.0)
MCH: 28.4 pg (ref 26.0–34.0)
MCHC: 33.1 g/dL (ref 30.0–36.0)
MCV: 85.9 fL (ref 78.0–100.0)
Platelets: 432 10*3/uL — ABNORMAL HIGH (ref 150–400)
RBC: 3.27 MIL/uL — ABNORMAL LOW (ref 4.22–5.81)
RDW: 15.4 % (ref 11.5–15.5)
WBC: 12.1 10*3/uL — ABNORMAL HIGH (ref 4.0–10.5)

## 2010-07-15 LAB — BASIC METABOLIC PANEL
BUN: 21 mg/dL (ref 6–23)
CO2: 28 mEq/L (ref 19–32)
Calcium: 8.3 mg/dL — ABNORMAL LOW (ref 8.4–10.5)
Chloride: 103 mEq/L (ref 96–112)
Creatinine, Ser: 1.17 mg/dL (ref 0.4–1.5)
GFR calc Af Amer: >60 mL/min (ref >60)
GFR calc non Af Amer: >60 mL/min (ref >60)
Glucose, Bld: 106 mg/dL — ABNORMAL HIGH (ref 70–99)
Potassium: 3.5 mEq/L (ref 3.5–5.1)
Sodium: 140 mEq/L (ref 135–145)

## 2010-07-15 LAB — GLUCOSE, CAPILLARY
Glucose-Capillary: 110 mg/dL — ABNORMAL HIGH (ref 70–99)
Glucose-Capillary: 123 mg/dL — ABNORMAL HIGH (ref 70–99)
Glucose-Capillary: 113 mg/dL — ABNORMAL HIGH (ref 70–99)
Glucose-Capillary: 106 mg/dL — ABNORMAL HIGH (ref 70–99)

## 2010-07-15 LAB — PROTIME-INR
INR: 2.38 — ABNORMAL HIGH (ref 0.00–1.49)
Prothrombin Time: 26.1 seconds — ABNORMAL HIGH (ref 11.6–15.2)

## 2010-07-16 ENCOUNTER — Inpatient Hospital Stay (HOSPITAL_COMMUNITY): Payer: BC Managed Care – PPO

## 2010-07-16 LAB — GLUCOSE, CAPILLARY
Glucose-Capillary: 105 mg/dL — ABNORMAL HIGH (ref 70–99)
Glucose-Capillary: 105 mg/dL — ABNORMAL HIGH (ref 70–99)

## 2010-07-16 LAB — PROTIME-INR
INR: 2.17 — ABNORMAL HIGH (ref 0.00–1.49)
Prothrombin Time: 24.3 seconds — ABNORMAL HIGH (ref 11.6–15.2)

## 2010-07-16 LAB — BASIC METABOLIC PANEL
BUN: 22 mg/dL (ref 6–23)
CO2: 27 mEq/L (ref 19–32)
Calcium: 8.1 mg/dL — ABNORMAL LOW (ref 8.4–10.5)
Chloride: 102 mEq/L (ref 96–112)
Creatinine, Ser: 1.19 mg/dL (ref 0.4–1.5)
GFR calc Af Amer: >60 mL/min (ref >60)
GFR calc non Af Amer: >60 mL/min (ref >60)
Glucose, Bld: 104 mg/dL — ABNORMAL HIGH (ref 70–99)
Potassium: 3.6 mEq/L (ref 3.5–5.1)
Sodium: 138 mEq/L (ref 135–145)

## 2010-07-17 NOTE — Discharge Summary (Signed)
Carl Steele, Carl Steele NO.:  [id number]  MEDICAL RECORD NO.:  [id number]           PATIENT TYPE:  I  LOCATION:  2033                         FACILITY:  [facility]  PHYSICIAN:  Sheliah Plane, MD    DATE OF BIRTH:  1950-12-27  DATE OF ADMISSION:  06/30/2010 DATE OF DISCHARGE:  07/16/2010                              DISCHARGE SUMMARY   ADMITTING DIAGNOSES: 1. Multivessel coronary artery disease (with an ejection fraction of     55%). 2. History of hypertension. 3. History of type 2 diabetes. 4. History of hyperlipidemia.  DISCHARGE DIAGNOSES: 1. Multivessel coronary artery disease (with an ejection fraction of     55%). 2. History of hypertension. 3. History of type 2 diabetes. 4. History of hyperlipidemia. 5. Postoperative atrial fibrillation with rapid ventricular response     (conversion to sinus rhythm prior to discharge). 6. Volume overload. 7. Probable postoperative ileus.8. Acute blood loss anemia. 9. Hypokalemia.  PROCEDURES: 1. Cardiac catheterization performed on June 30, 2010, by Dr.     Herbie Baltimore. 2. CABG x4 (LIMA to LAD, SVG to diagonal 2, SVG to distal circ, SVG to     posterior descending coronary artery with EVH of the right thigh     and calf and open harvesting of the left lower extremity by Dr.     Tyrone Sage on July 07, 2010.  HISTORY OF PRESENTING ILLNESS:  This is a 60 year old Caucasian male with the aforementioned past medical history who had an onset of substernal chest discomfort/pain, radiating to the left shoulder on at least 5 occasions since January 2012.  He also noted some irregular heart beats.  Because of these symptoms, the patient presented to primary care physician, Dr. Nicholos Johns, who was concerned about an abnormal EKG.  The patient was then referred to Cardiology where dipyridamole stress test was performed.  This showed no EKG changes but a question of inferior ischemia.  Because of the patient's risk profile, it  was ultimately decided for the patient to undergo cardiac catheterization.  However, the patient wished to be treated medically. He then apparently went to the gym and had some similar symptoms with shortness of breath but these were not like the episodes in January 2012.  The patient presented to [facility] on June 30, 2010, to undergo a cardiac catheterization.  He was found to have multivessel coronary artery disease with preserved ejection fraction.  A Cardiothoracic consultation was obtained with Dr. Tyrone Sage for the consideration of coronary bypass grafting surgery.  The patient underwent a carotid duplex, carotid ultrasound which showed a 40-59% bilateral internal carotid artery stenosis.  ABIs were 0.8 bilaterally.  Potential risks, complications, and benefits of the surgery discussed with the patient and agreed to proceed.  He underwent CABG x4 on July 07, 2010.  BRIEF HOSPITAL COURSE STAY:  The patient was extubated without difficulty the evening of surgery.  He was requiring milrinone postoperatively.  These were weaned off.  [place] A-line chest tubes and Foley were all removed early in his postoperative course.  He was started on a low-dose beta-blocker and this was titrated accordingly. He was found to have  acute blood loss anemia.  His H and H went as low as 8.5 and 25.2.  He did not require postoperative transfusion.  He was placed on Nu-Iron.  He was found to be volume overload and diuresed accordingly.  He then went into atrial fibrillation with rapid ventricular rate.  He was initially placed on an amiodarone drip.  He remained in atrial fibrillation for a couple of days.  Digoxin was added.  His beta-blocker was increased and he was placed on Coumadin, however, he then converted to sinus rhythm.  As a result, Coumadin was stopped.  It was felt at that time if he remained in atrial fibrillation that he may need to undergo cardioversion.  The patient did have  some abdominal distention.  He remained afebrile and on abdominal exam, he was nontender.  It was felt that he also did not have any nausea or vomiting.  It was thought he most likely had a postoperative ileus and this did eventually resolved.  In addition, the patient then developed loose stools.  A Clostridium difficile was checked which was negative. The patient was felt surgically stable for transfer from Intensive Care into [facility] for further convalescence on July 11, 2010.  He continued to progress with cardiac rehab.  As previously stated, he was a diabetic. He was initially placed on an insulin drip.  His blood sugars remained well controlled with insulin, his oral regimen of metformin was resumed and the insulin was stopped.  The patient continued to progress such that currently he is in sinus rhythm, heart rate is in the 60s, BP 120/67.  He remains afebrile, O2 sats 93-94% on room air.  On physical examination, cardiovascular, regular rate and rhythm.  Pulmonary, slight crackles at the bases.  Extremity, lower extremity edema.  Wound was clean, dry, and continuing to heal.  His preoperative weight was 147 kg. His weight today was 152.7 kg.  The patient was also found to be hypokalemic on several different occasions and was given potassium supplementation accordingly.  His last potassium was up to 3.7. Chest tube sutures and EPW will be removed prior to discharge Provided the patient remains afebrile, in sinus rhythm, and hemodynamically stable, and pending morning round evaluation, he will be surgically stable for discharge on July 16, 2010.  LATEST LABORATORY STUDIES:  BMET done on July 14, 2010, potassium 3.7, BUN and creatinine 22 and 1.09 respectively.  PT and INR 25.8 and 2.34. Coumadin has been stopped as of today.  CBC done on July 14, 2010, H and H 8.5 and 25.9, white count 10,800, platelet count 358,000.  Last chest x-ray done on July 12, 2010, showed bibasilar  atelectasis, small bilateral pleural effusions, no pneumothorax.  A 2D echocardiogram has been ordered and we will wait these results.  DISCHARGE INSTRUCTIONS:  Include the following: Diet, the patient to remain on low-sodium, heart-healthy, diabetic diet. Activity, the patient may walk up steps.  He may shower, not to lift more than 10 pounds for 4 weeks, not to drive until after 4 weeks, to continue with his breathing exercise daily, to walk daily, and increase his friction duration as tolerates. Wound care, he is to use soap and water on his wound.  He is to contact the office if any wound problems arise.  FOLLOWUP APPOINTMENTS: 1. The patient is to contact Dr. Elissa Hefty office for followup     appointment in 2 weeks. 2. The patient has an appointment to see Dr. Tyrone Sage on August 10, 2010, at 12 p.m., 45 minutes prior to this office appointment, a     chest x-ray will be obtained.  DISCHARGE MEDICATIONS:  At the time of this dictation include the following: 1. Amiodarone 400 mg p.o. 3 times daily with last dose on July 17, 2010, and the patient takes 200 mg p.o. 2 times daily thereafter. 2. Digoxin 0.125 mg p.o. daily. 3. Zetia 10 mg at bedtime. 4. Folic acid 1 mg p.o. daily. 5. Lasix 40 mg p.o. daily x5 days. 6. Potassium chloride 20 mEq p.o. daily x5 days. 7. Mucinex 600 mg p.o. q.12 hours p.r.n. cough. 8. Nu-Iron 150 mg p.o. daily. 9. Oxycodone 5 mg 1-2 tabs every 4-6 hours as needed for pain. 10.Crestor 10 mg p.o. at bedtime. 11.Enteric-coated aspirin 81 mg p.o. daily. 12.Cilostazol 100 mg p.o. 2 times daily. 13.Metformin 500 mg 2 tablets p.o. 2 times daily. 14.Metoprolol tartrate 25 mg p.o. 2 times daily.  Patient not placed on an ACE/ARB as Ef preserved and blood pressure well controlled with above regimen.    Doree Fudge, PA   ______________________________ Sheliah Plane, MD    DZ/MEDQ  D:  07/14/2010  T:  07/15/2010  Job:  [id number]  cc:    Georgianne Fick, M.D. Landry Corporal, MD  Electronically Signed by Doree Fudge PA on 07/17/2010 10:41:16 AM Electronically Signed by Sheliah Plane MD on 07/17/2010 04:15:32 PM

## 2010-07-19 NOTE — Discharge Summary (Signed)
  NAMEDAMEAN, POFFENBERGER NO.:  [id number]  MEDICAL RECORD NO.:  [id number]           PATIENT TYPE:  I  LOCATION:  2033                         FACILITY:  [facility]  PHYSICIAN:  Sheliah Plane, MD    DATE OF BIRTH:  1950-06-12  DATE OF ADMISSION:  07/07/2010 DATE OF DISCHARGE:  07/16/2010                              DISCHARGE SUMMARY   ADDENDUM: Mr. Gang has been seen and evaluated on July 16, 2010.  He continues to remain volume overloaded but his edema is slowly improving with diuresis.  He currently is 5 kg above his preoperative weight.  LABORATORY DATA:  On July 16, 2010, show a sodium of 138, potassium 3.6 which is being repleted, BUN 22, creatinine 1.19.  He has otherwise remained stable and is progressing well.  He is ready for discharge home at this time.  DISCHARGE MEDICATIONS: 1. Amiodarone 400 mg t.i.d. until July 17, 2010, then 200 mg b.i.d.     thereafter. 2. Digoxin 0.125 mg daily. 3. Zetia 10 mg at bedtime. 4. Folic acid 1 mg daily. 5. Lasix 40 mg daily x2 weeks. 6. Nu-Iron 150 mg daily. 7. Oxycodone IR 1-2 tablets q.4-6 h. p.r.n. for pain. 8. Potassium 20 mEq daily x 14 days. 9. Crestor 10 mg at bedtime. 10.Metformin 1000 mg b.i.d. 11.Aspirin 81 mg daily. 12.Cilostazol 100 mg b.i.d. 13.Metoprolol 25 mg b.i.d.  DISCHARGE INSTRUCTIONS:  Unchanged from the previously dictated discharge summary.  He will follow up as previously dictated with Dr. Herbie Baltimore and Dr. Tyrone Sage.  We will also arrange a 1-week follow-up in the [facility] office for staple removal and wound recheck.  In the interim, if he experiences any problems or has questions, he is asked to contact our office immediately.     Coral Ceo, P.A.   ______________________________ Sheliah Plane, MD    GC/MEDQ  D:  07/16/2010  T:  07/16/2010  Job:  [id number]  cc:   Georgianne Fick, M.D. Landry Corporal, MD  Electronically Signed by Coral Ceo P.A. on 07/18/2010  10:32:50 AM Electronically Signed by Sheliah Plane MD on 07/19/2010 04:19:37 PM

## 2010-07-20 ENCOUNTER — Encounter (INDEPENDENT_AMBULATORY_CARE_PROVIDER_SITE_OTHER): Payer: Self-pay

## 2010-07-20 DIAGNOSIS — I2510 Atherosclerotic heart disease of native coronary artery without angina pectoris: Secondary | ICD-10-CM

## 2010-07-26 ENCOUNTER — Other Ambulatory Visit: Payer: Self-pay | Admitting: Cardiothoracic Surgery

## 2010-07-26 DIAGNOSIS — I2510 Atherosclerotic heart disease of native coronary artery without angina pectoris: Secondary | ICD-10-CM

## 2010-07-27 ENCOUNTER — Ambulatory Visit
Admission: RE | Admit: 2010-07-27 | Discharge: 2010-07-27 | Disposition: A | Discharge status: Home / Self Care | Payer: BC Managed Care – PPO | Source: Ambulatory Visit | Attending: Cardiothoracic Surgery | Admitting: Cardiothoracic Surgery

## 2010-07-27 ENCOUNTER — Ambulatory Visit (INDEPENDENT_AMBULATORY_CARE_PROVIDER_SITE_OTHER): Payer: Self-pay | Admitting: Cardiothoracic Surgery

## 2010-07-27 DIAGNOSIS — I2510 Atherosclerotic heart disease of native coronary artery without angina pectoris: Secondary | ICD-10-CM

## 2010-07-27 NOTE — Assessment & Plan Note (Signed)
OFFICE VISIT  Carl Steele, Carl Steele DOB:  04-22-1950                                        July 27, 2010 CHART #:  [id number]  The patient  returns to the office today in followup after his recent coronary artery bypass grafting x4 done on July 07, 2010.  The patient had some atrial fibrillation postoperatively, but now has had now has been at home and doing well, increasing his physical activity appropriately without overt symptoms of the congestive heart failure. He is now down 19 pounds below when he was admitted to the hospital prior to surgery.  On exam today, his blood pressure 134/84, pulse 64, respiratory rate is 20, O2 sats 96%.  His sternum is stable and well healed.  The chest tube sites are both in the midline and to the left are opened and granulating.  There is no evidence of infection, continuing with local wound care with these.  His lower extremities are improved without significant edema.  Follow up chest x-ray shows some left pleural effusion that has persisted not to the degree of requiring thoracentesis, but will continue on Lasix.  His medications include: 1. Amiodarone 200 mg b.i.d., I have asked him to cut this back to 200     mg once a day. 2. Digoxin 0.125 a day. 3. Zetia 10 mg daily. 4. Folic acid. 5. Iron. 6. Crestor 10 mg. 7. Metformin 1000 mg b.i.d. 8. Aspirin 81 mg a day. 9. Citrosol 100 mcg b.i.d. 10.Metoprolol 25 b.i.d. 11.He had been on pain medicine, but notes he is no longer taking it. 12.Lasix 40 mg once a day.  I have asked him to continue the Lasix for     an additional 2 weeks until I seem him back in the office.  We will plan on starting cardiac rehab in the next 1-2 weeks.  He is to see Dr. Herbie Baltimore, next week.  I will plan to see him back in 2 weeks with a followup chest x-ray to check on the status of the effusion and the chest tube wound sites.  Sheliah Plane, MD Electronically Signed  EG/MEDQ  D:   07/27/2010  T:  07/27/2010  Job:  [id number]  cc:   Landry Corporal, MD

## 2010-08-09 ENCOUNTER — Other Ambulatory Visit: Payer: Self-pay | Admitting: Cardiothoracic Surgery

## 2010-08-09 DIAGNOSIS — J90 Pleural effusion, not elsewhere classified: Secondary | ICD-10-CM

## 2010-08-10 ENCOUNTER — Encounter (INDEPENDENT_AMBULATORY_CARE_PROVIDER_SITE_OTHER): Payer: Self-pay | Admitting: Cardiothoracic Surgery

## 2010-08-10 ENCOUNTER — Encounter: Payer: BC Managed Care – PPO | Admitting: Cardiothoracic Surgery

## 2010-08-10 ENCOUNTER — Ambulatory Visit
Admission: RE | Admit: 2010-08-10 | Discharge: 2010-08-10 | Disposition: A | Discharge status: Home / Self Care | Payer: BC Managed Care – PPO | Source: Ambulatory Visit | Attending: Cardiothoracic Surgery | Admitting: Cardiothoracic Surgery

## 2010-08-10 ENCOUNTER — Ambulatory Visit: Payer: BC Managed Care – PPO | Admitting: Cardiothoracic Surgery

## 2010-08-10 DIAGNOSIS — J90 Pleural effusion, not elsewhere classified: Secondary | ICD-10-CM

## 2010-08-10 DIAGNOSIS — I2510 Atherosclerotic heart disease of native coronary artery without angina pectoris: Secondary | ICD-10-CM

## 2010-08-10 NOTE — Assessment & Plan Note (Signed)
OFFICE VISIT  MARION, ROSENBERRY DOB:  [birth year]                                        August 10, 2010 CHART #:  [id number]  The patient returns to the office today in followup after his recent coronary artery bypass grafting done on July 07, 2010.  When he was seen in the office July 27, 2010, he had difficulty with areas of his chest tube sites healing has been continued with local wound care here. He was started on Lasix for 2 weeks because of some small left pleural effusion.  Since last seen, his edema is decreased and his weight has decreased by 30 pounds.  Overall, he is feeling better.  His driving started in cardiac rehab today.  His wife has returned to [place] and he is primarily taken care of himself.  On exam, his blood pressure is 113/65, pulse is 68, respiratory rate is 97.  His sternum is stable and well healed.  The chest tube sites are granulating without evidence of infection and are now almost closed. His lower extremities are with decreased edema.  His lungs are clear.  Followup chest x-ray showed marked decrease in the left pleural effusion compared to the films several weeks ago.  He has completed his 2-week course of Lasix which I had given him, continues on amiodarone 200 mg once a day, digoxin 0.125 once a day, Zetia 10 mg a day, folic acid, iron, Crestor 10 mg daily, metformin 1000 b.i.d., aspirin 81 mg a day, cilostazol 100 mg twice a day, metoprolol 25 twice a day, and Atacand 8 mg a day.  Overall, he seems to be making progress.  I have encouraged him to continue with the cardiac rehab program, plan to see him back in 3 weeks for followup.  Sheliah Plane, MD Electronically Signed  EG/MEDQ  D:  08/10/2010  T:  08/10/2010  Job:  [id number]  cc:   Landry Corporal, MD Georgianne Fick, M.D.

## 2010-08-14 ENCOUNTER — Other Ambulatory Visit: Payer: Self-pay | Admitting: Cardiology

## 2010-08-14 ENCOUNTER — Encounter (HOSPITAL_COMMUNITY): Payer: BC Managed Care – PPO | Attending: Cardiology

## 2010-08-14 ENCOUNTER — Encounter (HOSPITAL_COMMUNITY): Payer: BC Managed Care – PPO

## 2010-08-14 DIAGNOSIS — I209 Angina pectoris, unspecified: Secondary | ICD-10-CM | POA: Insufficient documentation

## 2010-08-14 DIAGNOSIS — Z5189 Encounter for other specified aftercare: Secondary | ICD-10-CM | POA: Insufficient documentation

## 2010-08-14 DIAGNOSIS — E785 Hyperlipidemia, unspecified: Secondary | ICD-10-CM | POA: Insufficient documentation

## 2010-08-14 DIAGNOSIS — E669 Obesity, unspecified: Secondary | ICD-10-CM | POA: Insufficient documentation

## 2010-08-14 DIAGNOSIS — I658 Occlusion and stenosis of other precerebral arteries: Secondary | ICD-10-CM | POA: Insufficient documentation

## 2010-08-14 DIAGNOSIS — I6529 Occlusion and stenosis of unspecified carotid artery: Secondary | ICD-10-CM | POA: Insufficient documentation

## 2010-08-14 DIAGNOSIS — E7800 Pure hypercholesterolemia, unspecified: Secondary | ICD-10-CM | POA: Insufficient documentation

## 2010-08-14 DIAGNOSIS — I4891 Unspecified atrial fibrillation: Secondary | ICD-10-CM | POA: Insufficient documentation

## 2010-08-14 DIAGNOSIS — Z7982 Long term (current) use of aspirin: Secondary | ICD-10-CM | POA: Insufficient documentation

## 2010-08-14 DIAGNOSIS — Z9861 Coronary angioplasty status: Secondary | ICD-10-CM | POA: Insufficient documentation

## 2010-08-14 DIAGNOSIS — I2510 Atherosclerotic heart disease of native coronary artery without angina pectoris: Secondary | ICD-10-CM | POA: Insufficient documentation

## 2010-08-14 DIAGNOSIS — E119 Type 2 diabetes mellitus without complications: Secondary | ICD-10-CM | POA: Insufficient documentation

## 2010-08-14 DIAGNOSIS — I10 Essential (primary) hypertension: Secondary | ICD-10-CM | POA: Insufficient documentation

## 2010-08-15 DIAGNOSIS — Z951 Presence of aortocoronary bypass graft: Secondary | ICD-10-CM | POA: Insufficient documentation

## 2010-08-16 ENCOUNTER — Encounter (HOSPITAL_COMMUNITY): Payer: BC Managed Care – PPO

## 2010-08-16 ENCOUNTER — Encounter (HOSPITAL_COMMUNITY): Payer: BC Managed Care – PPO | Attending: Cardiology

## 2010-08-16 ENCOUNTER — Other Ambulatory Visit: Payer: Self-pay | Admitting: Cardiology

## 2010-08-16 DIAGNOSIS — Z7982 Long term (current) use of aspirin: Secondary | ICD-10-CM | POA: Insufficient documentation

## 2010-08-16 DIAGNOSIS — E119 Type 2 diabetes mellitus without complications: Secondary | ICD-10-CM | POA: Insufficient documentation

## 2010-08-16 DIAGNOSIS — I2510 Atherosclerotic heart disease of native coronary artery without angina pectoris: Secondary | ICD-10-CM | POA: Insufficient documentation

## 2010-08-16 DIAGNOSIS — I658 Occlusion and stenosis of other precerebral arteries: Secondary | ICD-10-CM | POA: Insufficient documentation

## 2010-08-16 DIAGNOSIS — E785 Hyperlipidemia, unspecified: Secondary | ICD-10-CM | POA: Insufficient documentation

## 2010-08-16 DIAGNOSIS — Z9861 Coronary angioplasty status: Secondary | ICD-10-CM | POA: Insufficient documentation

## 2010-08-16 DIAGNOSIS — E669 Obesity, unspecified: Secondary | ICD-10-CM | POA: Insufficient documentation

## 2010-08-16 DIAGNOSIS — E7800 Pure hypercholesterolemia, unspecified: Secondary | ICD-10-CM | POA: Insufficient documentation

## 2010-08-16 DIAGNOSIS — I6529 Occlusion and stenosis of unspecified carotid artery: Secondary | ICD-10-CM | POA: Insufficient documentation

## 2010-08-16 DIAGNOSIS — I4891 Unspecified atrial fibrillation: Secondary | ICD-10-CM | POA: Insufficient documentation

## 2010-08-16 DIAGNOSIS — Z5189 Encounter for other specified aftercare: Secondary | ICD-10-CM | POA: Insufficient documentation

## 2010-08-16 DIAGNOSIS — I209 Angina pectoris, unspecified: Secondary | ICD-10-CM | POA: Insufficient documentation

## 2010-08-16 DIAGNOSIS — I10 Essential (primary) hypertension: Secondary | ICD-10-CM | POA: Insufficient documentation

## 2010-08-18 ENCOUNTER — Encounter (HOSPITAL_COMMUNITY): Payer: BC Managed Care – PPO

## 2010-08-18 ENCOUNTER — Other Ambulatory Visit: Payer: Self-pay | Admitting: Cardiology

## 2010-08-21 ENCOUNTER — Other Ambulatory Visit: Payer: Self-pay | Admitting: Cardiology

## 2010-08-21 ENCOUNTER — Encounter (HOSPITAL_COMMUNITY): Payer: BC Managed Care – PPO

## 2010-08-21 LAB — GLUCOSE, CAPILLARY: Glucose-Capillary: 95 mg/dL (ref 70–99)

## 2010-08-23 ENCOUNTER — Encounter (HOSPITAL_COMMUNITY): Payer: BC Managed Care – PPO

## 2010-08-23 ENCOUNTER — Other Ambulatory Visit: Payer: Self-pay | Admitting: Cardiology

## 2010-08-23 LAB — GLUCOSE, CAPILLARY: Glucose-Capillary: 90 mg/dL (ref 70–99)

## 2010-08-25 ENCOUNTER — Encounter (HOSPITAL_COMMUNITY): Payer: BC Managed Care – PPO

## 2010-08-25 ENCOUNTER — Other Ambulatory Visit: Payer: Self-pay | Admitting: Cardiology

## 2010-08-28 ENCOUNTER — Encounter (HOSPITAL_COMMUNITY): Payer: BC Managed Care – PPO

## 2010-08-28 ENCOUNTER — Other Ambulatory Visit: Payer: Self-pay | Admitting: Cardiology

## 2010-08-28 LAB — GLUCOSE, CAPILLARY: Glucose-Capillary: 92 mg/dL (ref 70–99)
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
## 2010-08-30 ENCOUNTER — Encounter (HOSPITAL_COMMUNITY): Payer: BC Managed Care – PPO

## 2010-08-30 ENCOUNTER — Other Ambulatory Visit: Payer: Self-pay | Admitting: Cardiology

## 2010-08-30 LAB — GLUCOSE, CAPILLARY: Glucose-Capillary: 100 mg/dL — ABNORMAL HIGH (ref 70–99)

## 2010-08-31 ENCOUNTER — Ambulatory Visit (INDEPENDENT_AMBULATORY_CARE_PROVIDER_SITE_OTHER): Payer: Self-pay | Admitting: Cardiothoracic Surgery

## 2010-08-31 DIAGNOSIS — I2510 Atherosclerotic heart disease of native coronary artery without angina pectoris: Secondary | ICD-10-CM
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
## 2010-09-01 ENCOUNTER — Other Ambulatory Visit: Payer: Self-pay | Admitting: Cardiology

## 2010-09-01 ENCOUNTER — Encounter (HOSPITAL_COMMUNITY): Payer: BC Managed Care – PPO

## 2010-09-04 ENCOUNTER — Other Ambulatory Visit: Payer: Self-pay | Admitting: Cardiology

## 2010-09-04 ENCOUNTER — Encounter (HOSPITAL_COMMUNITY): Payer: BC Managed Care – PPO

## 2010-09-06 ENCOUNTER — Encounter (HOSPITAL_COMMUNITY): Payer: BC Managed Care – PPO

## 2010-09-08 ENCOUNTER — Other Ambulatory Visit: Payer: Self-pay | Admitting: Cardiology

## 2010-09-08 ENCOUNTER — Encounter (HOSPITAL_COMMUNITY): Payer: BC Managed Care – PPO

## 2010-09-08 LAB — GLUCOSE, CAPILLARY: Glucose-Capillary: 107 mg/dL — ABNORMAL HIGH (ref 70–99)

## 2010-09-11 ENCOUNTER — Encounter (HOSPITAL_COMMUNITY): Payer: BC Managed Care – PPO

## 2010-09-13 ENCOUNTER — Encounter (HOSPITAL_COMMUNITY): Payer: BC Managed Care – PPO

## 2010-09-13 ENCOUNTER — Ambulatory Visit (INDEPENDENT_AMBULATORY_CARE_PROVIDER_SITE_OTHER): Payer: Self-pay | Admitting: Cardiothoracic Surgery

## 2010-09-13 DIAGNOSIS — I2510 Atherosclerotic heart disease of native coronary artery without angina pectoris: Secondary | ICD-10-CM

## 2010-09-14 NOTE — Assessment & Plan Note (Signed)
OFFICE VISIT  SINCLAIR, ALLIGOOD DOB:  Jan 08, 1951                                        Sep 13, 2010 CHART #:  [id number]  Carl Steele is a 60 year old diabetic status post multivessel bypass grafting in March by Dr. Tyrone Sage who has been doing well in outpatient rehab.  However, in the past 2-3 days redness in his right thigh has developed associated with some swelling and tenderness.  He is sent here from the rehab facility to be examined.  He denies fever or hyperglycemia and he has not been taking any antibiotics.  His other incisions are all well healed.  PHYSICAL EXAMINATION:  He is afebrile, 99.1, blood pressure 120/76, pulse 55, saturation 98%.  Breath sounds are clear.  The sternum is well healed.  Cardiac rhythm is regular.  The right thigh has diffuse area of erythema and some induration and minimal fluctuance, but there does appear to be some fluid in the soft tissue, probable liquefied hematoma. The perfusion of the right foot is adequate.  Under sterile prep and drape and 1% local lidocaine, the right thigh hematoma was aspirated, 2 mL of liquefied blood and sent for cultures.  PLAN:  The patient will be started on oral antibiotics, Keflex 500 mg p.o. q.i.d.  If he does not show improvement within the next 36 hours, he will notify our office and he will probably need IV antibiotics and hospitalization.  Otherwise we plan on seeing him back for a recheck in the office in 5 days' time.  Kerin Perna, M.D. Electronically Signed  PV/MEDQ  D:  09/13/2010  T:  09/14/2010  Job:  [id number]

## 2010-09-15 ENCOUNTER — Encounter (HOSPITAL_COMMUNITY): Payer: BC Managed Care – PPO

## 2010-09-15 HISTORY — PX: DOPPLER ECHOCARDIOGRAPHY: SHX263

## 2010-09-18 ENCOUNTER — Other Ambulatory Visit: Payer: Self-pay | Admitting: Cardiology

## 2010-09-18 ENCOUNTER — Encounter (HOSPITAL_COMMUNITY): Payer: BC Managed Care – PPO | Attending: Cardiology

## 2010-09-18 ENCOUNTER — Encounter (INDEPENDENT_AMBULATORY_CARE_PROVIDER_SITE_OTHER): Payer: Self-pay

## 2010-09-18 ENCOUNTER — Encounter (HOSPITAL_COMMUNITY): Payer: BC Managed Care – PPO

## 2010-09-18 DIAGNOSIS — E7800 Pure hypercholesterolemia, unspecified: Secondary | ICD-10-CM | POA: Insufficient documentation

## 2010-09-18 DIAGNOSIS — I10 Essential (primary) hypertension: Secondary | ICD-10-CM | POA: Insufficient documentation

## 2010-09-18 DIAGNOSIS — Z9861 Coronary angioplasty status: Secondary | ICD-10-CM | POA: Insufficient documentation

## 2010-09-18 DIAGNOSIS — E119 Type 2 diabetes mellitus without complications: Secondary | ICD-10-CM | POA: Insufficient documentation

## 2010-09-18 DIAGNOSIS — E669 Obesity, unspecified: Secondary | ICD-10-CM | POA: Insufficient documentation

## 2010-09-18 DIAGNOSIS — I4891 Unspecified atrial fibrillation: Secondary | ICD-10-CM | POA: Insufficient documentation

## 2010-09-18 DIAGNOSIS — E785 Hyperlipidemia, unspecified: Secondary | ICD-10-CM | POA: Insufficient documentation

## 2010-09-18 DIAGNOSIS — L02419 Cutaneous abscess of limb, unspecified: Secondary | ICD-10-CM

## 2010-09-18 DIAGNOSIS — Z7982 Long term (current) use of aspirin: Secondary | ICD-10-CM | POA: Insufficient documentation

## 2010-09-18 DIAGNOSIS — I209 Angina pectoris, unspecified: Secondary | ICD-10-CM | POA: Insufficient documentation

## 2010-09-18 DIAGNOSIS — I2510 Atherosclerotic heart disease of native coronary artery without angina pectoris: Secondary | ICD-10-CM | POA: Insufficient documentation

## 2010-09-18 DIAGNOSIS — I658 Occlusion and stenosis of other precerebral arteries: Secondary | ICD-10-CM | POA: Insufficient documentation

## 2010-09-18 DIAGNOSIS — Z5189 Encounter for other specified aftercare: Secondary | ICD-10-CM | POA: Insufficient documentation

## 2010-09-18 DIAGNOSIS — I6529 Occlusion and stenosis of unspecified carotid artery: Secondary | ICD-10-CM | POA: Insufficient documentation

## 2010-09-18 LAB — GLUCOSE, CAPILLARY: Glucose-Capillary: 100 mg/dL — ABNORMAL HIGH (ref 70–99)

## 2010-09-19 NOTE — Assessment & Plan Note (Signed)
OFFICE VISIT  JUANITA, DEVINCENT DOB:  06-20-1950                                        September 18, 2010 CHART #:  [id number]  HISTORY:  The patient is a 60 year old obese, diabetic male who is status post coronary artery bypass grafting x4 on July 07, 2010.  He has been doing quite well, and undergoing cardiac rehabilitation with excellent control of his diabetes and significant weight loss of over 35 pounds since his surgery.  Last week, he was seen in the office by Dr. Kathlee Nations Trigt as he had developed cellulitis on the right thigh above- the-knee near previous endoscopic vein harvest site.  He was placed on oral antibiotics.  He is not certain the name of the antibiotic and unfortunately, I do not have the office note in the chart, but he has had substantial improvement in the erythema.  There is some induration, but this is also improved.  Pain is also substantially improved.  PHYSICAL EXAMINATION:  Vital Signs:  Blood pressure is 117/71, pulse is 50 and regular, respirations 16, oxygen saturation is 98%, temperature is 99.1.  General Appearance:  Well-developed, adult, obese, white male in no acute distress.  Pulmonary:  Reveals clear breath sounds bilaterally.  Cardiac:  Regular rate and rhythm.  Normal S1-S2. Extremities:  The extremities are examined.  There is no significant erythema associated with the previous endoscopic vein harvest site. There is some mild induration.  It has minimal tenderness to palpation. There is bilateral lower extremity edema, although fairly mild.  The culture was obtained by Dr. Donata Clay on Sep 13, 2010 and this has shown no growth.  Gram stain was also unremarkable with no organisms seen.  ASSESSMENT:  The patient is recovering well from his right thigh cellulitis associated with previous endoscopic vein harvest site.  I have encouraged him to continue his antibiotics until the prescription has been completed.  I  also encouraged him to continue cardiac rehabilitation.  We will see him again on a p.r.n. basis and he knows to call with any issues associated with the wound.  Rowe Clack, P.A.-C.  Sherryll Burger  D:  09/18/2010  T:  09/19/2010  Job:  [id number]  cc:   Sheliah Plane, MD Landry Corporal, MD

## 2010-09-20 ENCOUNTER — Encounter (HOSPITAL_COMMUNITY): Payer: BC Managed Care – PPO

## 2010-09-22 ENCOUNTER — Encounter (HOSPITAL_COMMUNITY): Payer: BC Managed Care – PPO

## 2010-09-22 HISTORY — PX: NM MYOCAR PERF WALL MOTION: HXRAD629

## 2010-09-25 ENCOUNTER — Encounter (HOSPITAL_COMMUNITY): Payer: BC Managed Care – PPO

## 2010-09-25 ENCOUNTER — Other Ambulatory Visit: Payer: Self-pay | Admitting: Cardiology

## 2010-09-25 LAB — GLUCOSE, CAPILLARY: Glucose-Capillary: 96 mg/dL (ref 70–99)

## 2010-09-27 ENCOUNTER — Encounter (HOSPITAL_COMMUNITY): Payer: BC Managed Care – PPO

## 2010-09-29 ENCOUNTER — Encounter (HOSPITAL_COMMUNITY): Payer: BC Managed Care – PPO

## 2010-10-02 ENCOUNTER — Other Ambulatory Visit: Payer: Self-pay | Admitting: Cardiology

## 2010-10-02 ENCOUNTER — Encounter (HOSPITAL_COMMUNITY): Payer: BC Managed Care – PPO

## 2010-10-02 LAB — GLUCOSE, CAPILLARY
Glucose-Capillary: 107 mg/dL — ABNORMAL HIGH (ref 70–99)
Glucose-Capillary: 88 mg/dL (ref 70–99)

## 2010-10-04 ENCOUNTER — Encounter (HOSPITAL_COMMUNITY): Payer: BC Managed Care – PPO

## 2010-10-06 ENCOUNTER — Encounter (HOSPITAL_COMMUNITY): Payer: BC Managed Care – PPO

## 2010-10-09 ENCOUNTER — Encounter (HOSPITAL_COMMUNITY): Payer: BC Managed Care – PPO

## 2010-10-11 ENCOUNTER — Encounter (HOSPITAL_COMMUNITY): Payer: BC Managed Care – PPO

## 2010-10-13 ENCOUNTER — Encounter (HOSPITAL_COMMUNITY): Payer: BC Managed Care – PPO

## 2010-10-16 ENCOUNTER — Encounter (HOSPITAL_COMMUNITY): Payer: BC Managed Care – PPO

## 2010-10-16 ENCOUNTER — Encounter (HOSPITAL_COMMUNITY): Payer: BC Managed Care – PPO | Attending: Cardiology

## 2010-10-16 DIAGNOSIS — E669 Obesity, unspecified: Secondary | ICD-10-CM | POA: Insufficient documentation

## 2010-10-16 DIAGNOSIS — I4891 Unspecified atrial fibrillation: Secondary | ICD-10-CM | POA: Insufficient documentation

## 2010-10-16 DIAGNOSIS — I209 Angina pectoris, unspecified: Secondary | ICD-10-CM | POA: Insufficient documentation

## 2010-10-16 DIAGNOSIS — Z5189 Encounter for other specified aftercare: Secondary | ICD-10-CM | POA: Insufficient documentation

## 2010-10-16 DIAGNOSIS — I2510 Atherosclerotic heart disease of native coronary artery without angina pectoris: Secondary | ICD-10-CM | POA: Insufficient documentation

## 2010-10-16 DIAGNOSIS — I10 Essential (primary) hypertension: Secondary | ICD-10-CM | POA: Insufficient documentation

## 2010-10-16 DIAGNOSIS — E7800 Pure hypercholesterolemia, unspecified: Secondary | ICD-10-CM | POA: Insufficient documentation

## 2010-10-16 DIAGNOSIS — Z7982 Long term (current) use of aspirin: Secondary | ICD-10-CM | POA: Insufficient documentation

## 2010-10-16 DIAGNOSIS — I658 Occlusion and stenosis of other precerebral arteries: Secondary | ICD-10-CM | POA: Insufficient documentation

## 2010-10-16 DIAGNOSIS — E785 Hyperlipidemia, unspecified: Secondary | ICD-10-CM | POA: Insufficient documentation

## 2010-10-16 DIAGNOSIS — I6529 Occlusion and stenosis of unspecified carotid artery: Secondary | ICD-10-CM | POA: Insufficient documentation

## 2010-10-16 DIAGNOSIS — E119 Type 2 diabetes mellitus without complications: Secondary | ICD-10-CM | POA: Insufficient documentation

## 2010-10-16 DIAGNOSIS — Z9861 Coronary angioplasty status: Secondary | ICD-10-CM | POA: Insufficient documentation

## 2010-10-18 ENCOUNTER — Encounter (HOSPITAL_COMMUNITY): Payer: BC Managed Care – PPO

## 2010-10-20 ENCOUNTER — Other Ambulatory Visit: Payer: Self-pay | Admitting: Cardiology

## 2010-10-20 ENCOUNTER — Encounter (HOSPITAL_COMMUNITY): Payer: BC Managed Care – PPO

## 2010-10-23 ENCOUNTER — Encounter (HOSPITAL_COMMUNITY): Payer: BC Managed Care – PPO

## 2010-10-25 ENCOUNTER — Encounter (HOSPITAL_COMMUNITY): Payer: BC Managed Care – PPO

## 2010-10-25 ENCOUNTER — Other Ambulatory Visit: Payer: Self-pay | Admitting: Cardiology

## 2010-10-26 ENCOUNTER — Ambulatory Visit: Payer: BC Managed Care – PPO | Admitting: Cardiothoracic Surgery

## 2010-10-27 ENCOUNTER — Encounter (HOSPITAL_COMMUNITY): Payer: BC Managed Care – PPO

## 2010-10-27 ENCOUNTER — Other Ambulatory Visit: Payer: Self-pay | Admitting: Cardiology

## 2010-10-30 ENCOUNTER — Encounter (HOSPITAL_COMMUNITY): Payer: BC Managed Care – PPO

## 2010-11-01 ENCOUNTER — Encounter (HOSPITAL_COMMUNITY): Payer: BC Managed Care – PPO

## 2010-11-02 ENCOUNTER — Ambulatory Visit: Payer: BC Managed Care – PPO | Admitting: Cardiothoracic Surgery

## 2010-11-03 ENCOUNTER — Encounter (HOSPITAL_COMMUNITY): Payer: BC Managed Care – PPO

## 2010-11-06 ENCOUNTER — Encounter (HOSPITAL_COMMUNITY): Payer: BC Managed Care – PPO

## 2010-11-08 ENCOUNTER — Encounter (HOSPITAL_COMMUNITY): Payer: BC Managed Care – PPO

## 2010-11-09 ENCOUNTER — Ambulatory Visit: Payer: BC Managed Care – PPO | Admitting: Cardiothoracic Surgery

## 2010-11-10 ENCOUNTER — Encounter (HOSPITAL_COMMUNITY): Payer: BC Managed Care – PPO

## 2010-11-13 ENCOUNTER — Encounter (HOSPITAL_COMMUNITY): Payer: BC Managed Care – PPO

## 2010-11-15 ENCOUNTER — Encounter (HOSPITAL_COMMUNITY): Payer: BC Managed Care – PPO

## 2010-11-16 ENCOUNTER — Ambulatory Visit (INDEPENDENT_AMBULATORY_CARE_PROVIDER_SITE_OTHER): Payer: BC Managed Care – PPO | Admitting: Cardiothoracic Surgery

## 2010-11-16 DIAGNOSIS — I2510 Atherosclerotic heart disease of native coronary artery without angina pectoris: Secondary | ICD-10-CM

## 2010-11-16 NOTE — Assessment & Plan Note (Addendum)
OFFICE VISIT  CHICO, CAWOOD DOB:  1950-05-04                                        November 16, 2010 CHART #:  [id number]  The patient returns today for followup visit after his coronary artery bypass grafting since he underwent coronary artery bypass grafting on July 07, 2010.  He has completed his cardiac rehab and reduced his BMI from 45-38.  His weight has decreased to 287 pounds.  He notes that he has not been this size for the last 18 years.  He notes that he is committed to continue with rehab training.  In the while, he has had no recurrent angina, comes in today to check his lower extremities which he had some difficulty with infection in the endovein harvest site on the right.  He has had no evidence of infection now.  On exam, his blood pressure 138/88, pulse 50, respiratory rate 16, and O2 sats 99%.  His sternum is stable and well healed.  His lungs are clear.  Cardiac exam reveals regular rate and rhythm without murmur or gallop.  His right both legs, vein harvest sites are healed without infection.  He has minimal edema at the ankles.  As he continues on Zetia, metformin, aspirin, metoprolol, and Diovan. Overall, I am very pleased with his progress especially his titration to continue with physical activity, strict control of his diet and diabetes control and also continued efforts at weight loss which he is so far as appeared to be successful doing this.  Sheliah Plane, MD Electronically Signed  EG/MEDQ  D:  11/16/2010  T:  11/16/2010  Job:  [id number]  cc:   Georgianne Fick, M.D. Landry Corporal, MD

## 2010-11-17 ENCOUNTER — Encounter (HOSPITAL_COMMUNITY): Payer: BC Managed Care – PPO

## 2011-01-05 LAB — CBC
HCT: 41.4
Hemoglobin: 14
MCHC: 33.9
MCV: 84.7
Platelets: 267
RBC: 4.89
RDW: 14.4
WBC: 14.4 — ABNORMAL HIGH

## 2011-01-05 LAB — I-STAT 8, (EC8 V) (CONVERTED LAB)
Acid-Base Excess: 1
BUN: 16
Bicarbonate: 24.6 — ABNORMAL HIGH
Chloride: 106
Glucose, Bld: 118 — ABNORMAL HIGH
HCT: 45
Hemoglobin: 15.3
Operator id: 272551
Potassium: 4
Sodium: 137
TCO2: 26
pCO2, Ven: 35.4 — ABNORMAL LOW
pH, Ven: 7.449 — ABNORMAL HIGH

## 2011-01-05 LAB — POCT I-STAT CREATININE
Creatinine, Ser: 1.1
Operator id: 272551

## 2011-01-05 LAB — PROTIME-INR
INR: 1
Prothrombin Time: 13.4

## 2011-02-02 ENCOUNTER — Other Ambulatory Visit: Payer: Self-pay | Admitting: Vascular Surgery

## 2011-12-19 IMAGING — CR DG CHEST 1V PORT
1 series · 1 of 1 positions shown · non-contrast
Comparison: 07/07/2010.

CLINICAL DATA: Postop CABG.

PORTABLE CHEST - 1 VIEW

[AP]
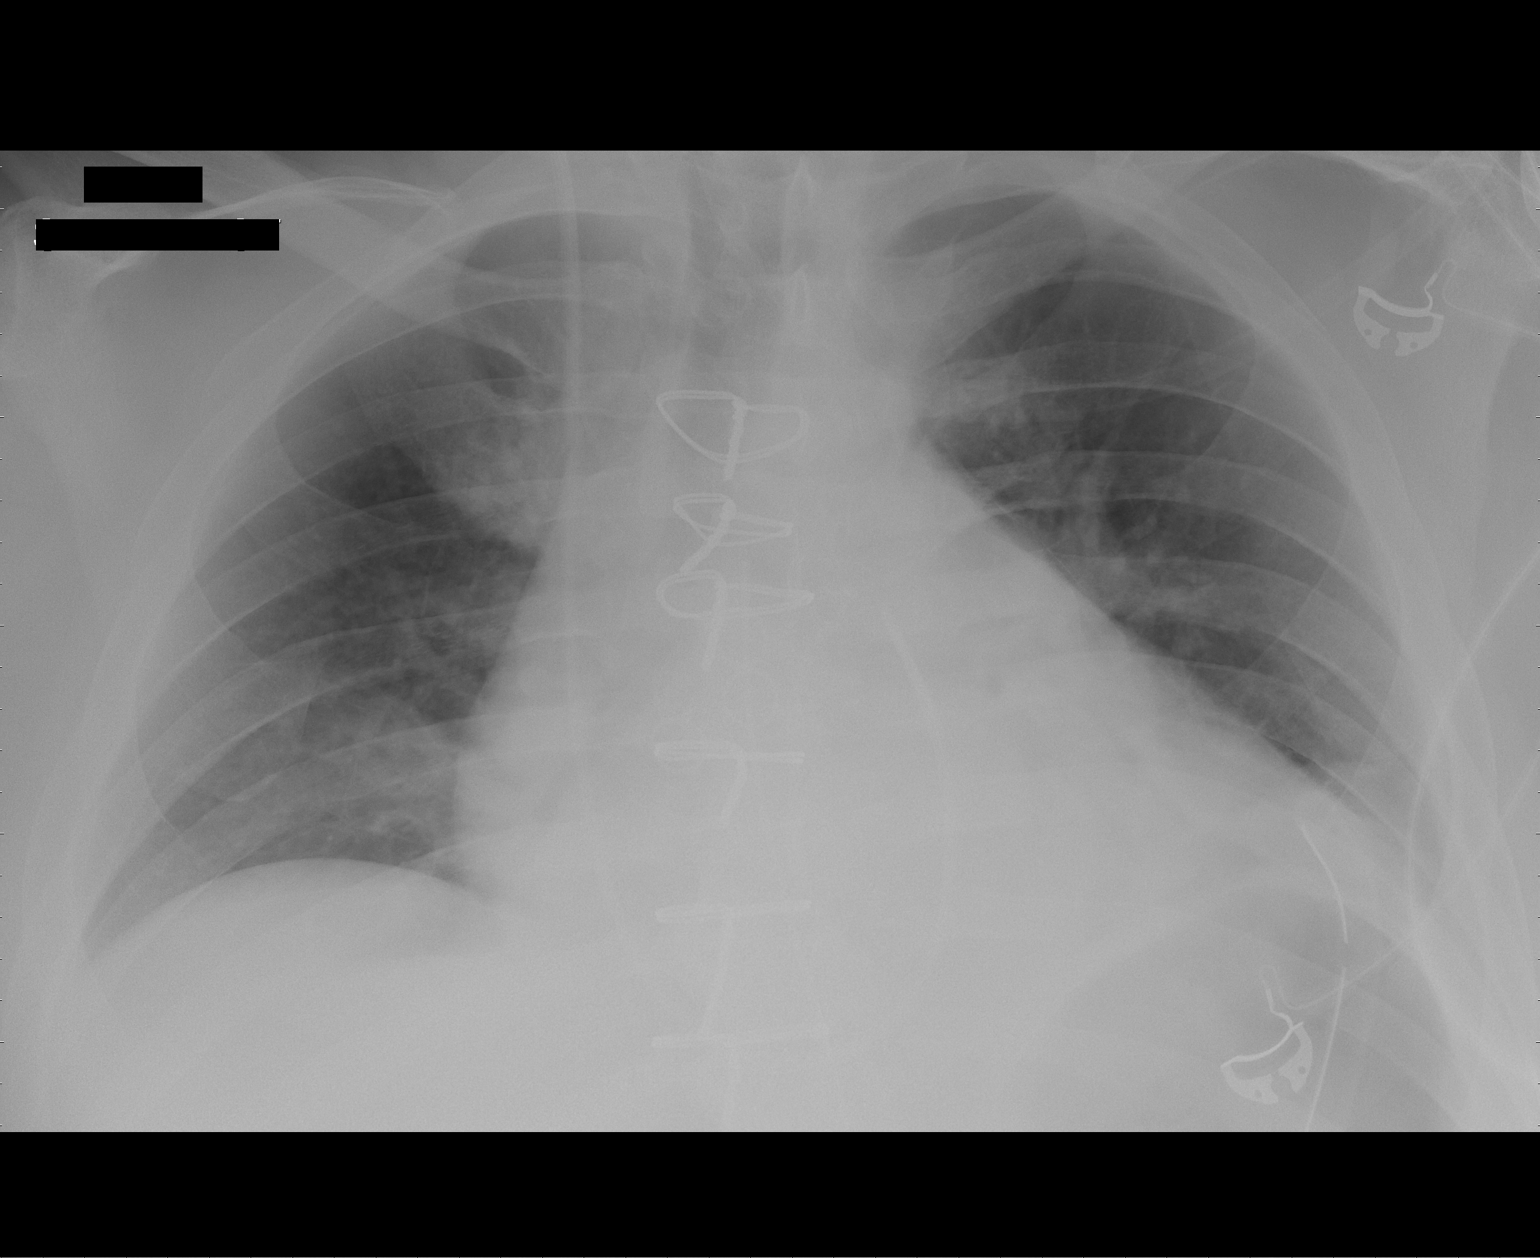

[1 of 1 positions shown; findings below may reference images not displayed]

FINDINGS: 5355 hours.  Interval extubation with removal of the
nasogastric tube.  Right IJ Swan-Ganz catheter tip remains in the
main pulmonary artery.  A left chest tube and mediastinal drain
remain in place.  No pneumothorax is demonstrated.  There are
persistent low lung volumes with bibasilar atelectasis.  Heart size
and mediastinal contours appear unchanged.
IMPRESSION: Stable basilar atelectasis status post extubation.  No edema or
pneumothorax demonstrated.

## 2011-12-20 IMAGING — CR DG CHEST 1V PORT
1 series · 1 of 1 positions shown · non-contrast
Comparison: Yesterday

CLINICAL DATA: Respiratory distress

PORTABLE CHEST - 1 VIEW

[AP]
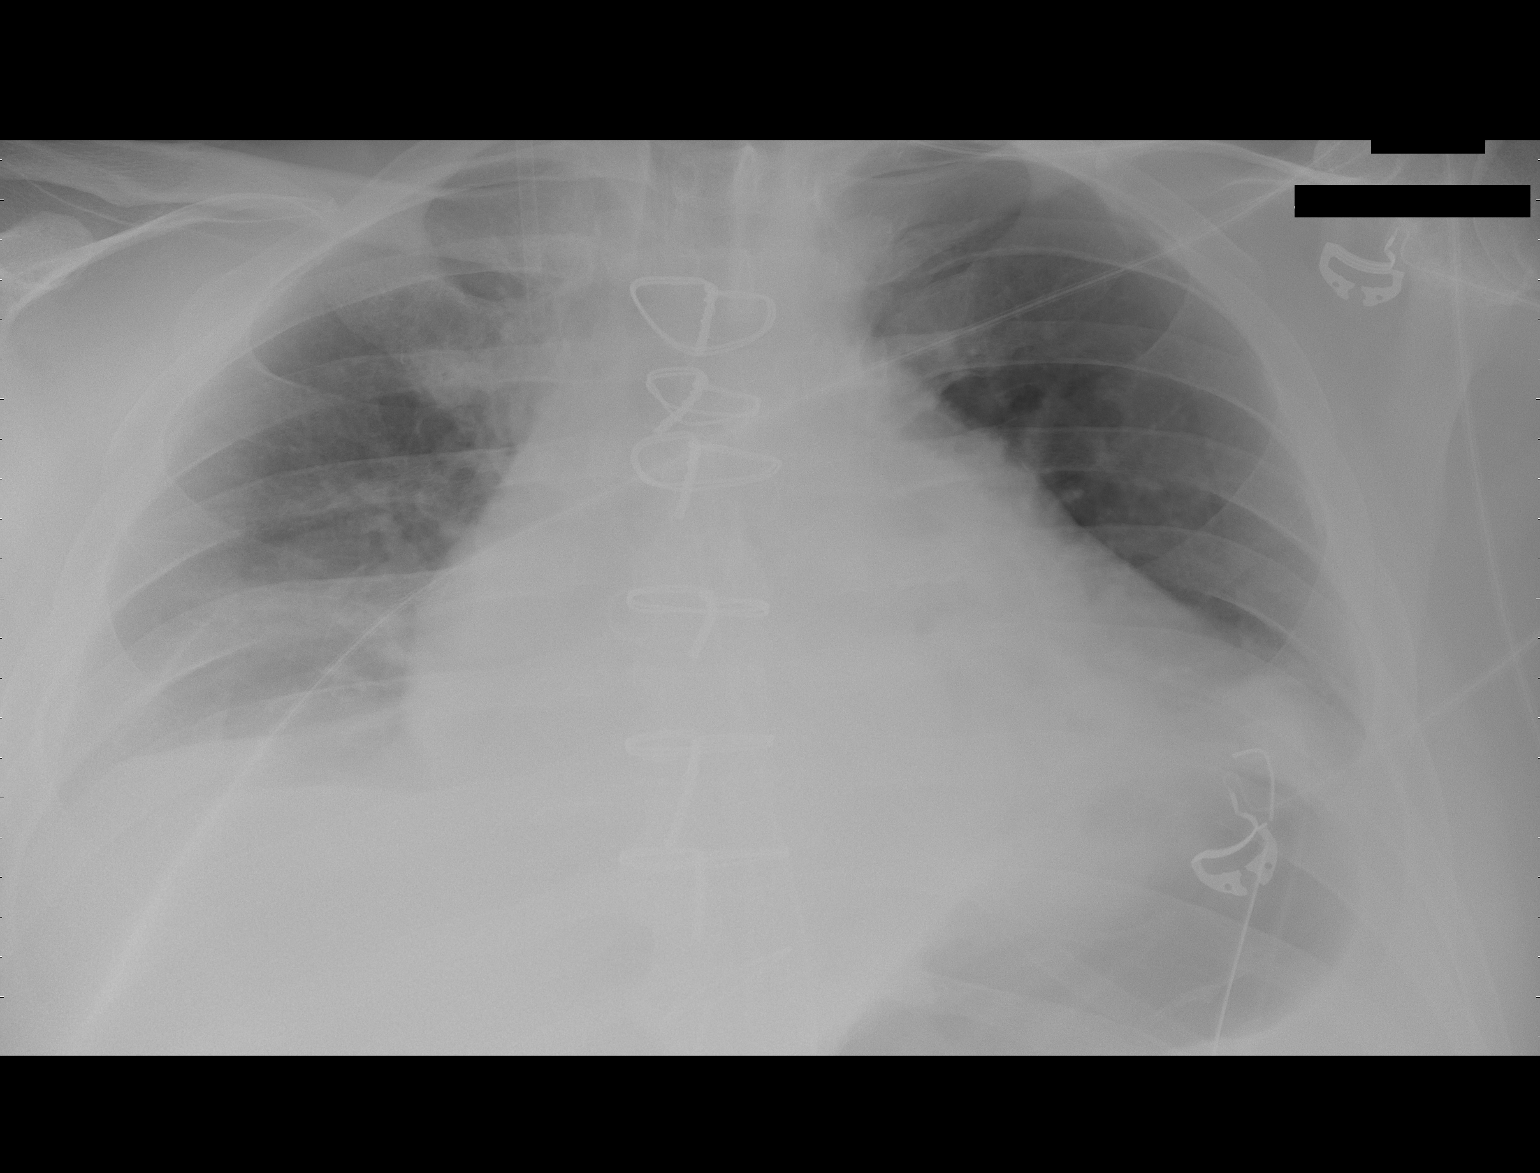

[1 of 1 positions shown; findings below may reference images not displayed]

FINDINGS: Swan-Ganz catheter removal of the introducer left in
place.  Lungs are under aerated with increasing bibasilar
atelectasis.  No pneumothorax.
IMPRESSION: Increasing bibasilar atelectasis.

## 2011-12-22 IMAGING — CR DG CHEST 1V PORT
1 series · 1 of 1 positions shown · non-contrast
Comparison: Portable chest x-rays yesterday and dating back to
07/07/2010.

CLINICAL DATA: Postop CABG.  Follow up atelectasis.

PORTABLE CHEST - 1 VIEW [DATE]/2442 4045 hours:

[view not recorded]
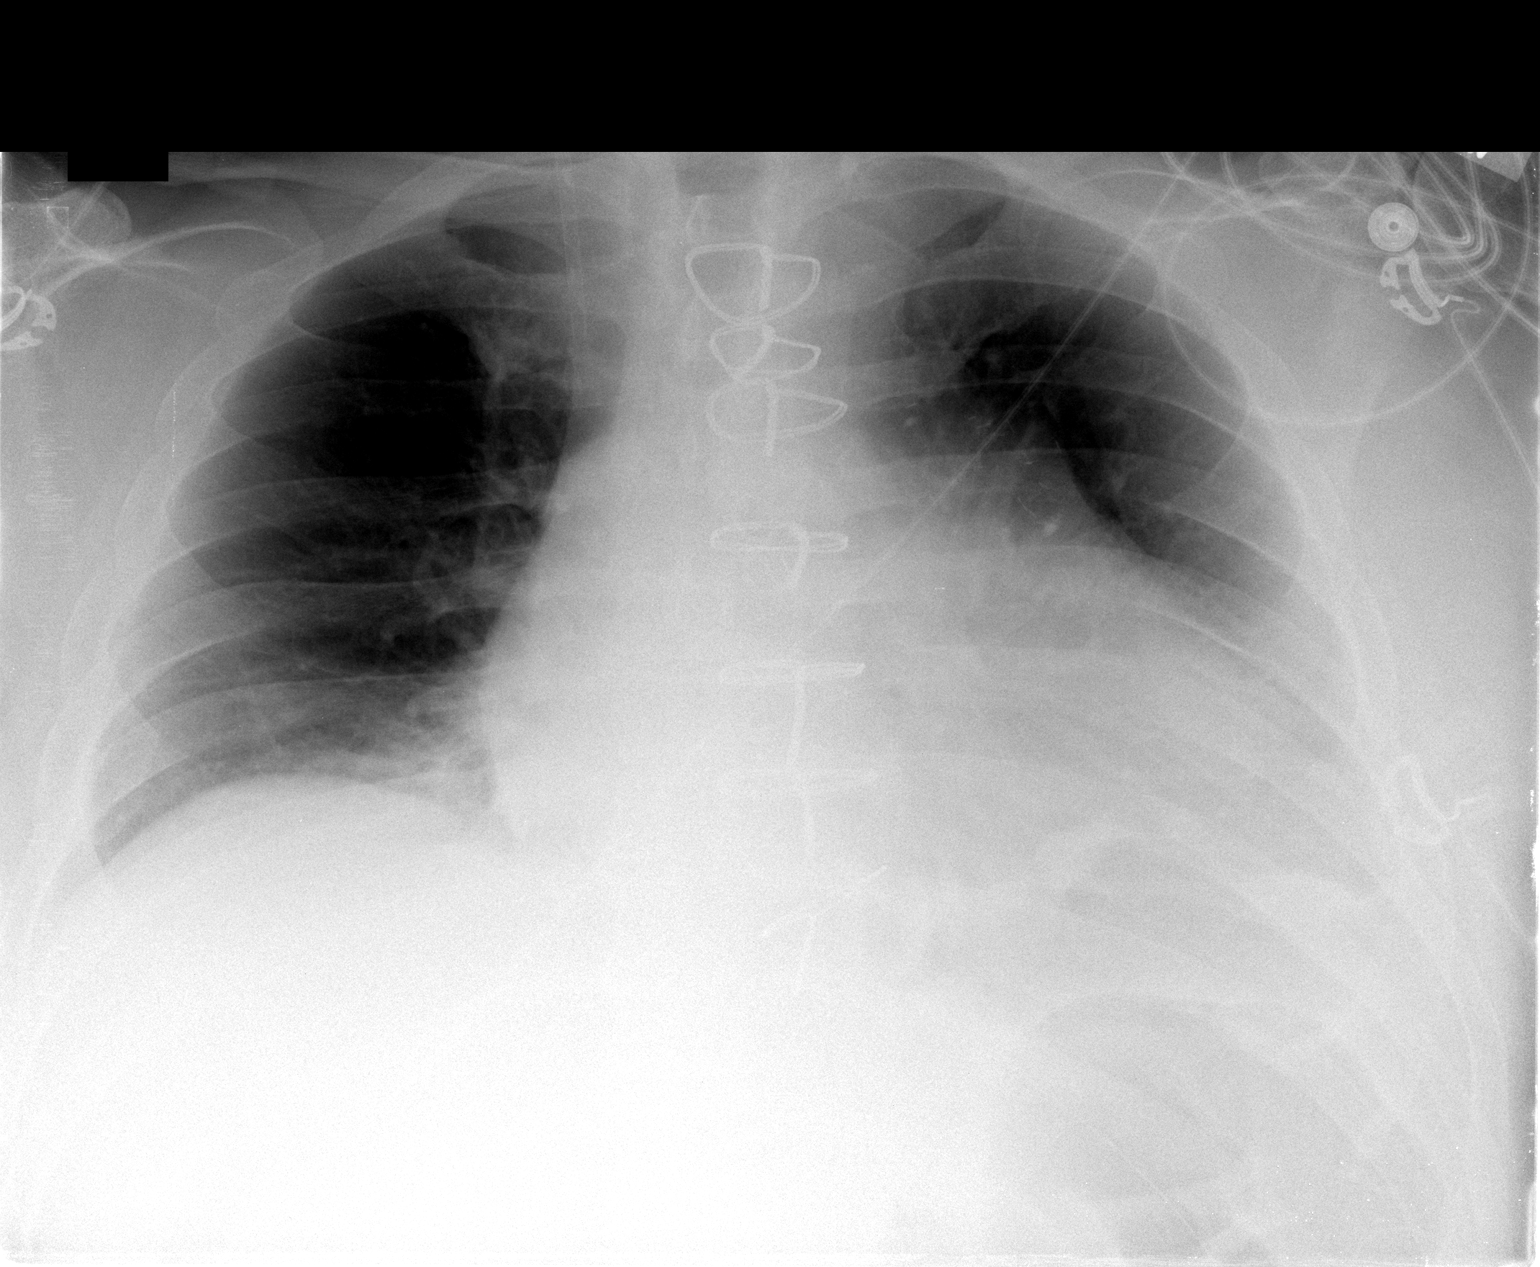

[1 of 1 positions shown; findings below may reference images not displayed]

FINDINGS: Sternotomy for CABG.  Cardiac silhouette enlarged but
stable.  Pulmonary vascularity normal.  Suboptimal inspiration
accounts for atelectasis in the lung bases, left greater than
right, unchanged.  No new pulmonary parenchymal abnormalities.
Right jugular introducer sheath tip remains in the SVC.
IMPRESSION: Suboptimal inspiration accounts for bibasilar atelectasis, left
greater than right, unchanged.  No acute cardiopulmonary disease
otherwise.  Stable cardiomegaly without pulmonary edema.

## 2012-01-07 IMAGING — CR DG CHEST 2V
2 series · 2 of 2 positions shown · non-contrast
Comparison: Two-view chest x-ray 07/16/2010 and 07/12/2010.

CLINICAL DATA: CABG 07/07/2010.  Routine follow-up.

CHEST - 2 VIEW 07/27/2010:

[w chest pa *]
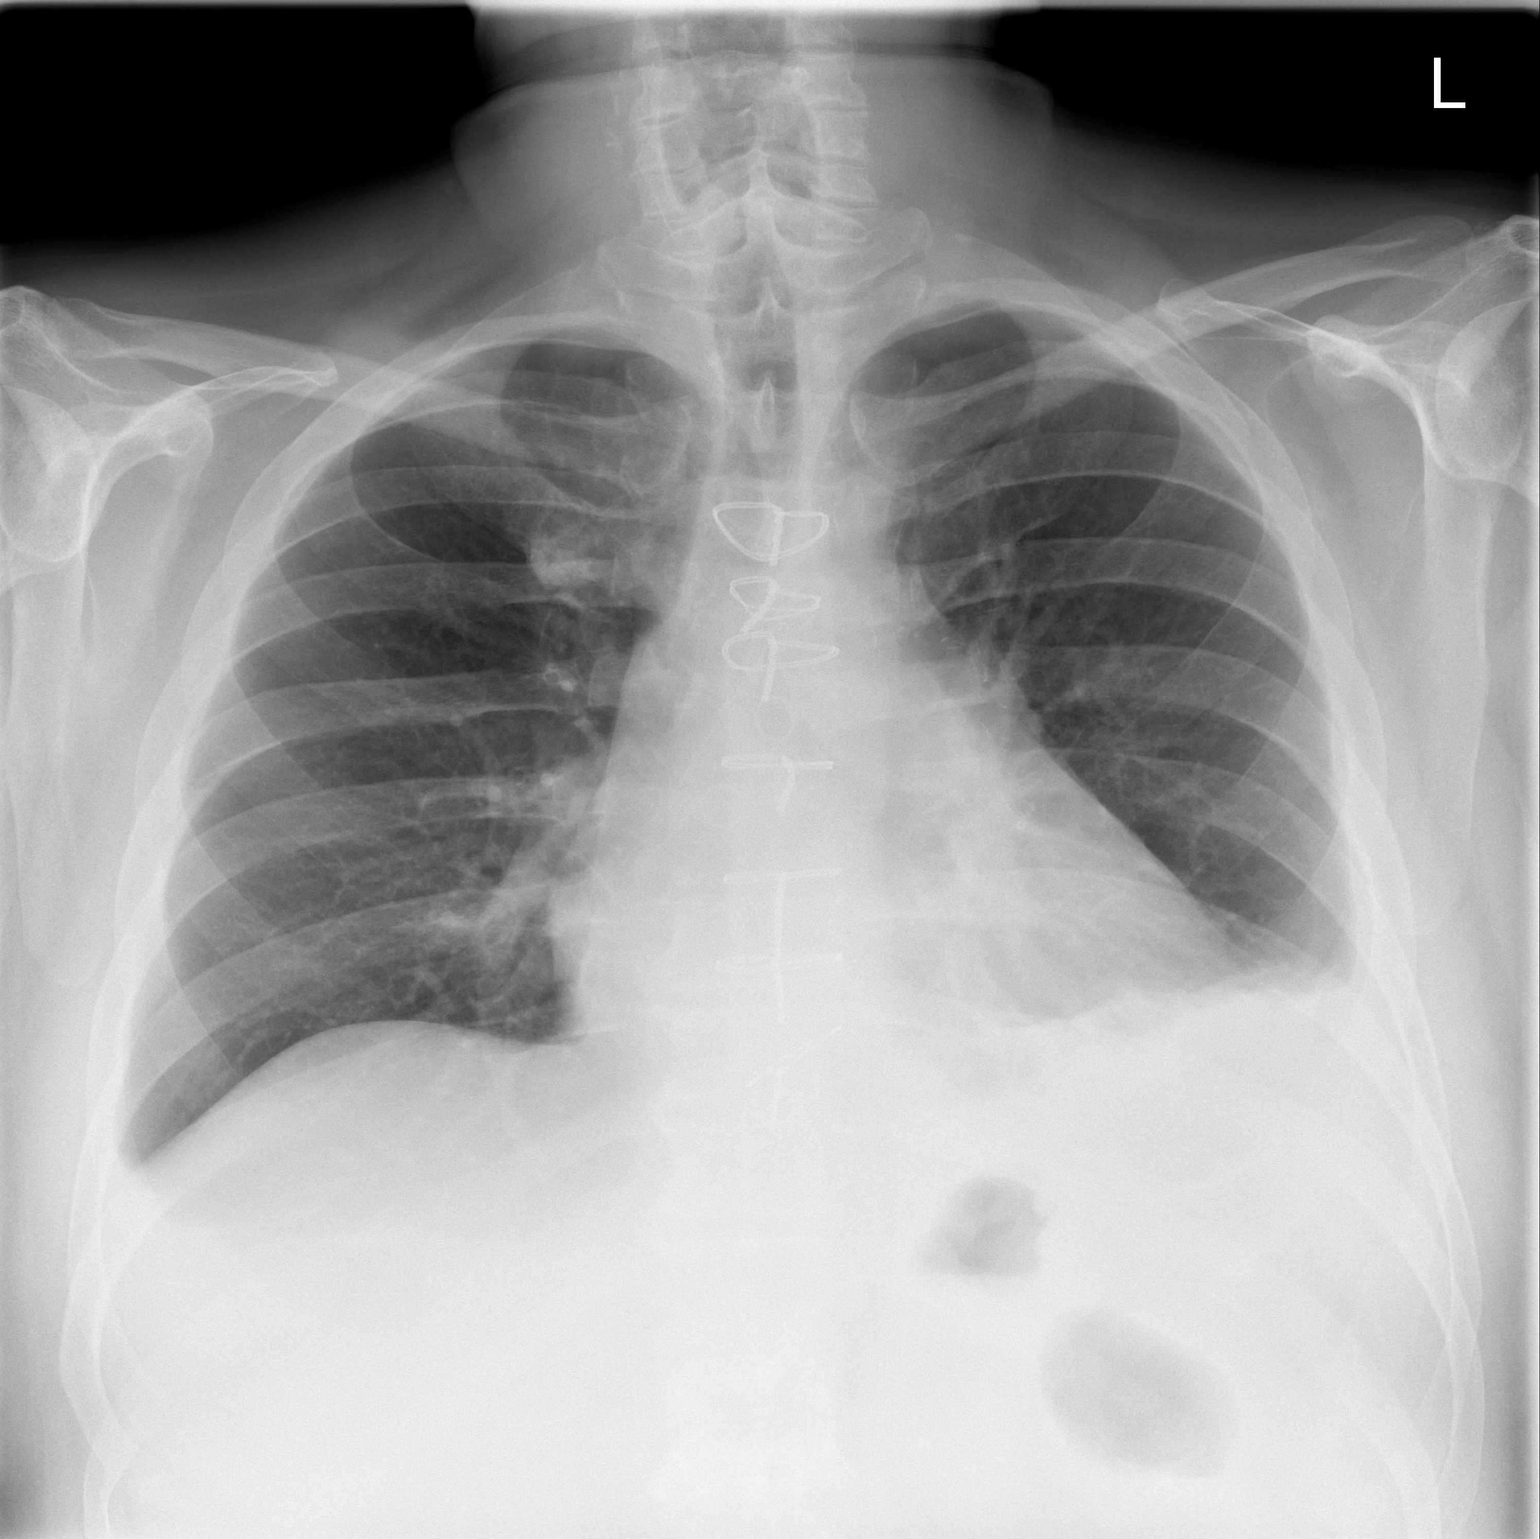

[w chest lat *]
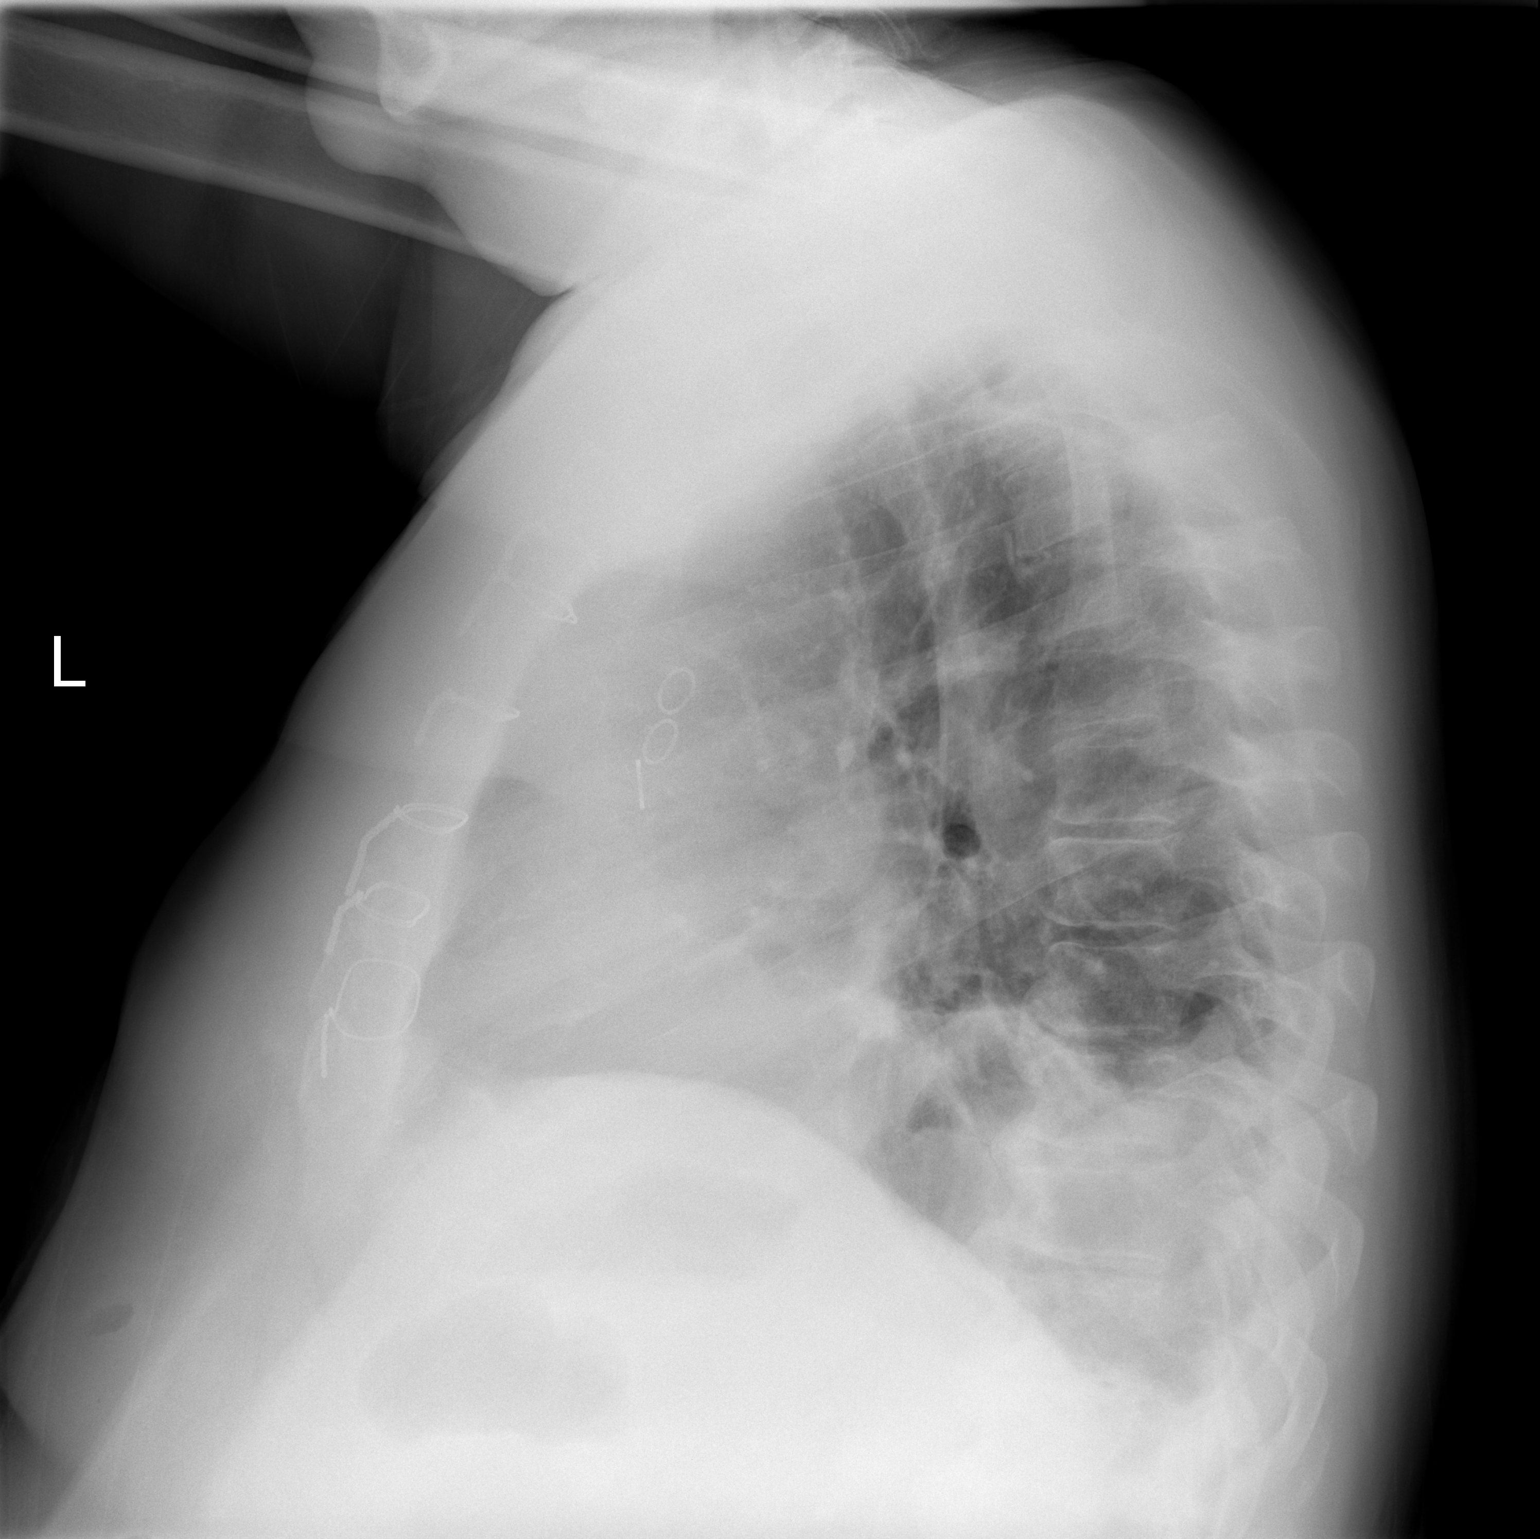

[2 of 2 positions shown; findings below may reference images not displayed]

FINDINGS: Sternotomy for CABG.  Cardiac silhouette mildly enlarged
but stable.  No significant change in the small bilateral pleural
effusions, left greater than right.  Associated passive atelectasis
in the left lower lobe, unchanged.  Resolution of the linear
atelectasis in the lingula.  No new pulmonary parenchymal
abnormalities.  Pulmonary vascularity normal without evidence of
pulmonary edema.  Mild degenerative changes again noted involving
the thoracic spine.
IMPRESSION: Stable mild cardiomegaly without pulmonary edema.  Stable bilateral
pleural effusions, left greater than right, and associated passive
atelectasis in the left lower lobe.  Resolution of lingular
atelectasis.  No new abnormalities.

## 2013-01-06 ENCOUNTER — Other Ambulatory Visit: Payer: Self-pay | Admitting: Cardiology

## 2013-01-09 NOTE — Telephone Encounter (Signed)
Rx was sent to pharmacy electronically. 

## 2013-06-22 ENCOUNTER — Encounter: Payer: Self-pay | Admitting: Cardiology

## 2013-06-22 ENCOUNTER — Ambulatory Visit (INDEPENDENT_AMBULATORY_CARE_PROVIDER_SITE_OTHER): Payer: 59 | Admitting: Cardiology

## 2013-06-22 VITALS — BP 140/70 | HR 66 | Ht 72.0 in | Wt 318.4 lb

## 2013-06-22 DIAGNOSIS — E785 Hyperlipidemia, unspecified: Secondary | ICD-10-CM

## 2013-06-22 DIAGNOSIS — Z6841 Body Mass Index (BMI) 40.0 and over, adult: Secondary | ICD-10-CM

## 2013-06-22 DIAGNOSIS — I2510 Atherosclerotic heart disease of native coronary artery without angina pectoris: Secondary | ICD-10-CM

## 2013-06-22 DIAGNOSIS — Z951 Presence of aortocoronary bypass graft: Secondary | ICD-10-CM

## 2013-06-22 DIAGNOSIS — E119 Type 2 diabetes mellitus without complications: Secondary | ICD-10-CM

## 2013-06-22 DIAGNOSIS — I10 Essential (primary) hypertension: Secondary | ICD-10-CM

## 2013-06-22 NOTE — Patient Instructions (Signed)
Your physician discussed the importance of regular exercise and recommended that you start or continue a regular exercise program for good health.  Your physician wants you to follow-up in 12 months Dr Herbie BaltimoreHarding.  You will receive a reminder letter in the mail two months in advance. If you don't receive a letter, please call our office to schedule the follow-up appointment.

## 2013-06-24 ENCOUNTER — Encounter: Payer: Self-pay | Admitting: Cardiology

## 2013-06-24 DIAGNOSIS — I10 Essential (primary) hypertension: Secondary | ICD-10-CM | POA: Insufficient documentation

## 2013-06-24 DIAGNOSIS — E118 Type 2 diabetes mellitus with unspecified complications: Secondary | ICD-10-CM | POA: Insufficient documentation

## 2013-06-24 DIAGNOSIS — E1169 Type 2 diabetes mellitus with other specified complication: Secondary | ICD-10-CM | POA: Insufficient documentation

## 2013-06-24 DIAGNOSIS — E119 Type 2 diabetes mellitus without complications: Secondary | ICD-10-CM | POA: Insufficient documentation

## 2013-06-24 DIAGNOSIS — E785 Hyperlipidemia, unspecified: Secondary | ICD-10-CM

## 2013-06-24 DIAGNOSIS — Z6841 Body Mass Index (BMI) 40.0 and over, adult: Secondary | ICD-10-CM

## 2013-06-24 NOTE — Progress Notes (Signed)
Carl Steele: Carl Steele MRN: [id number]  DOB: 09/07/1950   DOV:06/24/2013 PCP: Georgianne FickAMACHANDRAN,AJITH, MD  Clinic Note: Chief Complaint  Patient presents with  . Annual Exam    no complaints    HPI: Carl Steele is a 63 y.o. morbidly obese gentleman with a PMH below who presents today for annual followup of his CAD/CABG.  Interval History: [name] comes in today he almost hanging his head low in chain for having gained back weight. He fourth right admits that he has "gotten lazy over the last year and really got out of his exercise routine. He is also eating less conscientiously. Despite that, he really has plenty of energy with no exertional chest tightness or pressure or dyspnea. He still has the chronic aching in his chest post CABG but it is really if he does a lot of activity. He has trace edema but denies any PND orthopnea. Only minimal palpitations but no rapid  irregular heartbeats.  No lightheadedness, dizziness, weakness or syncope/near syncope. No TIA/amaurosis fugax symptoms. No melena, hematochezia hematuria. No claudication  Past Medical History  Diagnosis Date  . Hypertension   . Hyperlipidemia   . Diabetes mellitus without complication   . CAD, multiple vessel 06/26/2010    4 atypical chest pressure/dyspnea and frequent PVCs --> no ischemia on Myoview, but significant ectopy --> cardiac cath: 80% proximal LAD, 70% D2, occluded LAD after D2, 70-80% circumflex. Diffuse RCA and PDA lesions.;; Post-CABG Myoview 2012: No ischemia no infarction. EF 55% with reduced TID  . 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 fib without recurrence  . Morbid obesity with BMI of 40.0-44.9, adult   . Postoperative atrial fibrillation     No recurrence    Prior Cardiac Evaluation and Past Surgical History: Past Surgical History  Procedure Laterality Date  . Coronary artery bypass graft  07/07/2010    LIMA-LAD,SVG-D2,SVG-DISTAL CIRC,  SVG-PDA-POST OP COMPLICATED BY AFIB BUT NO RECURRENCE; endovascular harvest of vein from right thigh; open harvest from left lower leg.  . Cardiac catheterization  March 2012    Initial Myoview - TID 1.5, frequent PVCs (in pairs & triplets), no ischemia (likely balanced ischemia) --> CATH: 80% PROX LAD, 70% D2, AND AN OCCLUDED LAD AFTER D2 , 70 -80% CIRC WITH SMALL OM2 AND DIFFUSE PROX LESIONS RCA AND PDA  . 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 Nm myocar perf wall motion  09/22/2010    EF55%, TID was down t0 0.95 from 1.5 and no further arrhythmia    No Known Allergies  Current Outpatient Prescriptions  Medication Sig Dispense Refill  . aspirin 81 MG tablet Take 81 mg by mouth daily.      Marland Kitchen. atorvastatin (LIPITOR) 80 MG tablet Take 80 mg by mouth daily.      . metFORMIN (GLUCOPHAGE-XR) 500 MG 24 hr tablet Take 500 mg by mouth daily with breakfast.      . metoprolol tartrate (LOPRESSOR) 25 MG tablet Take 25 mg by mouth daily.      . valsartan (DIOVAN) 80 MG tablet Take 80 mg by mouth daily.       No current facility-administered medications for this visit.    History   Social History Narrative   He is a married father of one son who  is currently getting close to graduate from college and considering applying to medical school. His wife is actually a real estate agent in [place] and continued to back and forth. He is back working, but has significant for back his hours.   He does not, never did smoke and does not take alcohol.    ROS: A comprehensive Review of Systems - Negative except Symptoms noted in history of present illness.  PHYSICAL EXAM BP 140/70  Pulse 66  Ht 6' (1.829 m)  Wt 318 lb 6.4 oz (144.425 kg)  BMI 43.17 kg/m2  --> weight from visit March 2014 - 305 lb (in June 2012 - 298) General: He is a pleasant, morbidly  obese gentleman, he is in no acute distress, A&O x3, answers questions appropriately. Well-groomed and well-presented. Normal mood and affect. HEENT: NCAT, EOMI, MMM. Anicteric sclerae.  Neck: Supple without LAN, JVD or carotid bruit.  Heart: RRR, normal S1, S2. Soft 1-2/6 HSM heard at the apex.. No other rubs or gallops Lungs: CTAB, nonlabored, normal effort, good air movement.  Abdomen: Obese but soft/NT/ND/NABS. Unable to palpate HSM due to obesity.  Extremities: No C/C/E, 2+ and equal pulses throughout.  Neuro: Grossly normal  NGE:XBMWUXLKG today: Yes Rate:66 , Rhythm: NSR/SAR with occasional PVCs; normal axis, normal intervals. Normal EKG  Recent Labs: None available  ASSESSMENT / PLAN: CAD, multiple vessel His data will be to step backwards and this past year having gained some weight, notably "getting lazy ". He still try to monitor his dietary intake, but has significantly reduced his activity level.  He is relatively asymptomatic but no anginal symptoms, he is noting a few palpitations but none of the chest tightness and pressure that he had prior to his cath and CABG. He still has the discomfort in his chest following the CABG that is stable. He is on aspirin statin beta blocker and ARB.  S/P CABG x 4 Initial post CABG Myoview was negative for ischemia with a significant reduction in transient ischemic dilatation. That was in 2002. Next number he will be due for followup Myoview. Otherwise only mild sternotomy related MSK symptoms. This remains a very emotional and traumatic experience for him even to discuss.  Morbid obesity with BMI of 40.0-44.9, adult We spent quite a bit of time talking to his need to get back and exercise and continue to watch his diet. He is accompanied today by his son who is considering possibly going through the M. process from medical school. He seems quite apologetic toward his son for his lack of weight control. His goal further followup visit next year  will be getting down to below 300 pounds and hopefully down to 290.  He stated that prior to his problems he had actually gotten down into the high 280s. Last visit he was 298  Hypertension Borderline pressures today. He is on low-dose Toprol and low dose of Diovan. Heart rate 66, initial titration consideration would be with Diovan. For now will just simply monitor as we check his blood pressures every so often when he does workout.  Hyperlipidemia LDL goal <70 He is on 80 mg of atorvastatin. His labs and followed by his PCP.  Hhe tells noted they've been relatively well-controlled. I unfortunately do not have labs.  Diabetes mellitus without complication He remains on metformin alone. As his last hemoglobin A1c was much improved. He thinks was around 6.   Orders Placed This Encounter  Procedures  . EKG 12-Lead    Order Specific  Question:  Where should this test be performed    Answer:  OTHER   Followup: One year  DAVID W. Herbie Baltimore, M.D., M.S. Interventional Cardiology [facility]

## 2013-06-24 NOTE — Assessment & Plan Note (Signed)
He is on 80 mg of atorvastatin. His labs and followed by his PCP.  Hhe tells noted they've been relatively well-controlled. I unfortunately do not have labs.

## 2013-06-24 NOTE — Assessment & Plan Note (Addendum)
Initial post CABG Myoview was negative for ischemia with a significant reduction in transient ischemic dilatation. That was in 2002. Next number he will be due for followup Myoview. Otherwise only mild sternotomy related MSK symptoms. This remains a very emotional and traumatic experience for him even to discuss.

## 2013-06-24 NOTE — Assessment & Plan Note (Signed)
Borderline pressures today. He is on low-dose Toprol and low dose of Diovan. Heart rate 66, initial titration consideration would be with Diovan. For now will just simply monitor as we check his blood pressures every so often when he does workout.

## 2013-06-24 NOTE — Assessment & Plan Note (Addendum)
We spent quite a bit of time talking to his need to get back and exercise and continue to watch his diet. He is accompanied today by his son who is considering possibly going through the M. process from medical school. He seems quite apologetic toward his son for his lack of weight control. His goal further followup visit next year will be getting down to below 300 pounds and hopefully down to 290.  He stated that prior to his problems he had actually gotten down into the high 280s. Last visit he was 298

## 2013-06-24 NOTE — Assessment & Plan Note (Signed)
He remains on metformin alone. As his last hemoglobin A1c was much improved. He thinks was around 6.

## 2013-06-24 NOTE — Assessment & Plan Note (Signed)
His data will be to step backwards and this past year having gained some weight, notably "getting lazy ". He still try to monitor his dietary intake, but has significantly reduced his activity level.  He is relatively asymptomatic but no anginal symptoms, he is noting a few palpitations but none of the chest tightness and pressure that he had prior to his cath and CABG. He still has the discomfort in his chest following the CABG that is stable. He is on aspirin statin beta blocker and ARB.

## 2013-08-28 ENCOUNTER — Encounter: Payer: Self-pay | Admitting: Cardiology

## 2013-12-01 ENCOUNTER — Other Ambulatory Visit: Payer: Self-pay | Admitting: Cardiology

## 2013-12-02 NOTE — Telephone Encounter (Signed)
Rx was sent to pharmacy electronically. 

## 2014-01-16 ENCOUNTER — Emergency Department (HOSPITAL_COMMUNITY): Payer: 59

## 2014-01-16 ENCOUNTER — Encounter (HOSPITAL_COMMUNITY): Payer: Self-pay | Admitting: Emergency Medicine

## 2014-01-16 ENCOUNTER — Emergency Department (HOSPITAL_COMMUNITY)
Admission: EM | Admit: 2014-01-16 | Discharge: 2014-01-16 | Disposition: A | Discharge status: Home / Self Care | Payer: 59 | Attending: Emergency Medicine | Admitting: Emergency Medicine

## 2014-01-16 DIAGNOSIS — Z951 Presence of aortocoronary bypass graft: Secondary | ICD-10-CM | POA: Insufficient documentation

## 2014-01-16 DIAGNOSIS — R0789 Other chest pain: Secondary | ICD-10-CM | POA: Insufficient documentation

## 2014-01-16 DIAGNOSIS — E119 Type 2 diabetes mellitus without complications: Secondary | ICD-10-CM | POA: Insufficient documentation

## 2014-01-16 DIAGNOSIS — J9811 Atelectasis: Secondary | ICD-10-CM

## 2014-01-16 DIAGNOSIS — Z7982 Long term (current) use of aspirin: Secondary | ICD-10-CM | POA: Diagnosis not present

## 2014-01-16 DIAGNOSIS — Z791 Long term (current) use of non-steroidal anti-inflammatories (NSAID): Secondary | ICD-10-CM | POA: Insufficient documentation

## 2014-01-16 DIAGNOSIS — I10 Essential (primary) hypertension: Secondary | ICD-10-CM | POA: Insufficient documentation

## 2014-01-16 DIAGNOSIS — I4891 Unspecified atrial fibrillation: Secondary | ICD-10-CM | POA: Insufficient documentation

## 2014-01-16 DIAGNOSIS — Z79899 Other long term (current) drug therapy: Secondary | ICD-10-CM | POA: Diagnosis not present

## 2014-01-16 DIAGNOSIS — J029 Acute pharyngitis, unspecified: Secondary | ICD-10-CM | POA: Insufficient documentation

## 2014-01-16 DIAGNOSIS — R05 Cough: Secondary | ICD-10-CM | POA: Diagnosis not present

## 2014-01-16 DIAGNOSIS — R059 Cough, unspecified: Secondary | ICD-10-CM

## 2014-01-16 DIAGNOSIS — E785 Hyperlipidemia, unspecified: Secondary | ICD-10-CM | POA: Diagnosis not present

## 2014-01-16 DIAGNOSIS — I2510 Atherosclerotic heart disease of native coronary artery without angina pectoris: Secondary | ICD-10-CM | POA: Insufficient documentation

## 2014-01-16 LAB — COMPREHENSIVE METABOLIC PANEL
ALK PHOS: 99 U/L (ref 39–117)
ALT: 22 U/L (ref 0–53)
AST: 17 U/L (ref 0–37)
Albumin: 3.5 g/dL (ref 3.5–5.2)
Anion gap: 10 (ref 5–15)
BUN: 17 mg/dL (ref 6–23)
CO2: 28 mEq/L (ref 19–32)
Calcium: 9 mg/dL (ref 8.4–10.5)
Chloride: 99 mEq/L (ref 96–112)
Creatinine, Ser: 0.88 mg/dL (ref 0.50–1.35)
GFR calc Af Amer: >90 mL/min (ref >90)
GFR calc non Af Amer: >90 mL/min (ref >90)
GLUCOSE: 91 mg/dL (ref 70–99)
POTASSIUM: 4.2 meq/L (ref 3.7–5.3)
SODIUM: 137 meq/L (ref 137–147)
Total Bilirubin: 0.5 mg/dL (ref 0.3–1.2)
Total Protein: 7.1 g/dL (ref 6.0–8.3)

## 2014-01-16 LAB — CBC WITH DIFFERENTIAL/PLATELET
BASOS PCT: 0 % (ref 0–1)
Basophils Absolute: 0 10*3/uL (ref 0.0–0.1)
EOS ABS: 0.2 10*3/uL (ref 0.0–0.7)
Eosinophils Relative: 2 % (ref 0–5)
HCT: 42.1 % (ref 39.0–52.0)
HEMOGLOBIN: 14.1 g/dL (ref 13.0–17.0)
LYMPHS PCT: 27 % (ref 12–46)
Lymphs Abs: 2.6 10*3/uL (ref 0.7–4.0)
MCH: 28.8 pg (ref 26.0–34.0)
MCHC: 33.5 g/dL (ref 30.0–36.0)
MCV: 86.1 fL (ref 78.0–100.0)
Monocytes Absolute: 1 10*3/uL (ref 0.1–1.0)
Monocytes Relative: 10 % (ref 3–12)
NEUTROS PCT: 61 % (ref 43–77)
Neutro Abs: 6 10*3/uL (ref 1.7–7.7)
Platelets: 212 10*3/uL (ref 150–400)
RBC: 4.89 MIL/uL (ref 4.22–5.81)
RDW: 14.2 % (ref 11.5–15.5)
WBC: 9.8 10*3/uL (ref 4.0–10.5)

## 2014-01-16 LAB — I-STAT TROPONIN, ED: Troponin i, poc: 0.02 ng/mL (ref 0.00–0.08)

## 2014-01-16 MED ORDER — OMEPRAZOLE 20 MG PO CPDR: 20.0000 mg | DELAYED_RELEASE_CAPSULE | Freq: Every day | ORAL | Status: DC

## 2014-01-16 MED ORDER — LIDOCAINE VISCOUS 2 % MT SOLN
15.0000 mL | Freq: Once | OROMUCOSAL | Status: AC
Start: 2014-01-16 — End: 2014-01-16
  Administered 2014-01-16: 15 mL via OROMUCOSAL
  Filled 2014-01-16: qty 15

## 2014-01-16 MED ORDER — IBUPROFEN 800 MG PO TABS: 800.0000 mg | ORAL_TABLET | Freq: Three times a day (TID) | ORAL | Status: DC

## 2014-01-16 NOTE — ED Provider Notes (Signed)
Medical screening examination/treatment/procedure(s) were conducted as a shared visit with non-physician practitioner(s) or resident and myself. I personally evaluated the patient during the encounter and agree with the findings.  I have personally reviewed any xrays and/ or EKG's with the provider and I agree with interpretation.  Patient with sore throat for 6 weeks, congestion and recently left lower chest discomfort and cough for 2 days. Patient has completed 2 courses of antibiotics with no significant improvement. Patient does have high blood pressure, lipids, CAD, bypass however he said this is definitely not his heart. He feels this is probably a viral syndrome but he wanted to be assessed prior to following up again with his primary Dr. and ear nose and throat. Patient denies smoking history, no cancer history, no weight loss. Patient has mild scratchy throat with no specific association. No reflux history known. No specific timing. Chest is very mild worse with coughing. Patient has no blood clot risk factors. Patient feels well and strong otherwise. Patient has no exertional or diaphoretic component. On exam lungs are clear, heart rate regular rate and rhythm, no leg swelling or leg pain, very well-appearing.No trismus, uvular deviation, unilateral posterior pharyngeal edema or submandibular swelling.  Screening workup in ER unremarkable patient is comfortable with outpatient followup with primary Dr. and ENT. X-ray reviewed by myself showing mild atelectasis left lower lung, no fever in patient and recently completed antibiotics. I feel he can obtain repeat chest x-ray for resolution versus CT chest outpatient. Discussed trial of reflux medicine for chronic sore throat. No pertussis exposure.  Sore throat, left lower chest pain   Enid SkeensJoshua M Nyx Keady, MD 01/16/14 [phone]

## 2014-01-16 NOTE — ED Notes (Signed)
Pt sts he has completed two antibiotics for cough/sore throat has not been effective.

## 2014-01-16 NOTE — ED Notes (Signed)
Pot c/o sore throat x 6 weeks, left sided chest pain x 2-3 days. Pt reports having productive cough with dark grey secretions.

## 2014-01-16 NOTE — ED Notes (Signed)
Patient transported to X-ray 

## 2014-01-16 NOTE — ED Provider Notes (Signed)
CSN: [id number]     Arrival date & time 01/16/14  4098 History   First MD Initiated Contact with Patient 01/16/14 0930     Chief Complaint  Patient presents with  . Chest Pain  . Sore Throat   Patient is a 63 y.o. male presenting with chest pain and pharyngitis.  Chest Pain Associated symptoms: cough   Associated symptoms: no dysphagia, no fatigue, no fever, no nausea, no palpitations, no shortness of breath and not vomiting   Sore Throat Associated symptoms include chest pain, coughing and a sore throat. Pertinent negatives include no chills, congestion, fatigue, fever, nausea or vomiting.    Patient is a 63 y.o. Male who presents to the ED with sore throat and cough x 6 weeks and left sided chest pain x 2 days.  Patient states that he has had cough for the past six weeks with an associated sore throat.  Patient states that the sore throat is constant and unchanged.  Patient states that he has been seen at a [facility] facility twice and has been tested for strep which was negative and has been placed on both a Z pack and also augmentin with little relief. Patient states that he has also had a productive cough of clear sputum which turned dark grey.  Patient has developed left sided chest pain in the last two days which bothers him mostly when he is taking a deep breath and when he is coughing.  Pain is an achey soreness that is non-radiating.  Patient has a history of a quadruple bypass in 2012 and is followed by Dr. Herbie Baltimore for cardiology.    Past Medical History  Diagnosis Date  . Hypertension   . Hyperlipidemia   . Diabetes mellitus without complication   . CAD, multiple vessel 06/26/2010    4 atypical chest pressure/dyspnea and frequent PVCs --> no ischemia on Myoview, but significant ectopy --> cardiac cath: 80% proximal LAD, 70% D2, occluded LAD after D2, 70-80% circumflex. Diffuse RCA and PDA lesions.;; Post-CABG Myoview 2012: No ischemia no infarction. EF 55% with reduced TID  . 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 fib without recurrence  . Morbid obesity with BMI of 40.0-44.9, adult   . Postoperative atrial fibrillation     No recurrence   Past Surgical History  Procedure Laterality Date  . Coronary artery bypass graft  07/07/2010    LIMA-LAD,SVG-D2,SVG-DISTAL CIRC, SVG-PDA-POST OP COMPLICATED BY AFIB BUT NO RECURRENCE; endovascular harvest of vein from right thigh; open harvest from left lower leg.  . Cardiac catheterization  March 2012    Initial Myoview - TID 1.5, frequent PVCs (in pairs & triplets), no ischemia (likely balanced ischemia) --> CATH: 80% PROX LAD, 70% D2, AND AN OCCLUDED LAD AFTER D2 , 70 -80% CIRC WITH SMALL OM2 AND DIFFUSE PROX LESIONS RCA AND PDA  . 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 Nm myocar perf wall motion  09/22/2010    EF55%, TID was down t0 0.95 from 1.5 and no further arrhythmia    Family History  Problem Relation Age of Onset  . Diabetes Father    History  Substance Use Topics  . Smoking status: Never Smoker   . Smokeless tobacco: Never Used  . Alcohol Use: No  Review of Systems  Constitutional: Negative for fever, chills and fatigue.  HENT: Positive for sore throat. Negative for congestion, ear pain, facial swelling, nosebleeds, postnasal drip, rhinorrhea, sinus pressure, sneezing, trouble swallowing and voice change.   Respiratory: Positive for cough. Negative for chest tightness and shortness of breath.   Cardiovascular: Positive for chest pain. Negative for palpitations and leg swelling.  Gastrointestinal: Negative for nausea, vomiting, diarrhea and constipation.  Genitourinary: Negative for dysuria, urgency, frequency, hematuria and difficulty urinating.  All other systems reviewed and are  negative.     Allergies  Review of patient's allergies indicates no known allergies.  Home Medications   Prior to Admission medications   Medication Sig Start Date End Date Taking? Authorizing Provider  aspirin 81 MG tablet Take 81 mg by mouth daily.   Yes Historical Provider, MD  atorvastatin (LIPITOR) 80 MG tablet Take 80 mg by mouth daily.   Yes Historical Provider, MD  metFORMIN (GLUCOPHAGE-XR) 500 MG 24 hr tablet Take 500 mg by mouth daily with breakfast.   Yes Historical Provider, MD  metoprolol tartrate (LOPRESSOR) 25 MG tablet Take 25 mg by mouth 2 (two) times daily.    Yes Historical Provider, MD  valsartan (DIOVAN) 80 MG tablet Take 80 mg by mouth daily.   Yes Historical Provider, MD  ibuprofen (ADVIL,MOTRIN) 800 MG tablet Take 1 tablet (800 mg total) by mouth 3 (three) times daily. 01/16/14   Christiane Sistare A Forcucci, PA-C  omeprazole (PRILOSEC) 20 MG capsule Take 1 capsule (20 mg total) by mouth daily. 01/16/14   Allayna Erlich A Forcucci, PA-C   BP 136/89  Pulse 60  Temp(Src) 98.6 F (37 C) (Oral)  Resp 16  Ht 6' (1.829 m)  Wt 315 lb (142.883 kg)  BMI 42.71 kg/m2  SpO2 98% Physical Exam  Nursing note and vitals reviewed. Constitutional: He is oriented to person, place, and time. He appears well-developed and well-nourished. No distress.  HENT:  Head: Normocephalic and atraumatic.  Mouth/Throat: Oropharynx is clear and moist. No oropharyngeal exudate.  Eyes: Conjunctivae and EOM are normal. Pupils are equal, round, and reactive to light. No scleral icterus.  Neck: Normal range of motion. Neck supple. No JVD present. No thyromegaly present.  Cardiovascular: Normal rate, regular rhythm, normal heart sounds and intact distal pulses.  Exam reveals no gallop and no friction rub.   No murmur heard. Pulmonary/Chest: Effort normal and breath sounds normal. No respiratory distress. He has no wheezes. He has no rales. He exhibits no tenderness.  Abdominal: Soft. Bowel sounds are normal.  He exhibits no distension and no mass. There is no tenderness. There is no rebound and no guarding.  Musculoskeletal: Normal range of motion.  Lymphadenopathy:    He has no cervical adenopathy.  Neurological: He is alert and oriented to person, place, and time. He has normal strength. No cranial nerve deficit or sensory deficit. Coordination normal.  Skin: Skin is warm and dry. He is not diaphoretic.  Psychiatric: He has a normal mood and affect. His behavior is normal. Judgment and thought content normal.    ED Course  Procedures (including critical care time) Labs Review Labs Reviewed  CBC WITH DIFFERENTIAL  COMPREHENSIVE METABOLIC PANEL  I-STAT TROPOININ, ED    Imaging Review Dg Chest 2 View  01/16/2014   CLINICAL DATA:  Sore throat and cough for 6 weeks. Chest pain for 24-36 hr. Personal history of diabetes and hypertension.  EXAM: CHEST  2 VIEW  COMPARISON:  08/10/2010  FINDINGS: Sequelae of prior  CABG are again identified. Mild enlargement of the cardiac silhouette is unchanged. There is improved aeration of the left lung base. There is mild linear opacity in the left lung base. The lungs are otherwise clear. There is no pleural effusion or pneumothorax. No acute osseous abnormality is identified.  IMPRESSION: Mild left basilar opacity, likely atelectasis.   Electronically Signed   By: Sebastian Ache   On: 01/16/2014 11:20     EKG Interpretation   Date/Time:  Saturday January 16 2014 08:28:37 EDT Ventricular Rate:  61 PR Interval:  144 QRS Duration: 90 QT Interval:  408 QTC Calculation: 410 R Axis:   44 Text Interpretation:       Normal sinus rhythm Cannot rule out Anterior  infarct , age undetermined Abnormal ECG Confirmed by ZAVITZ  MD, JOSHUA  ([id number]) on 01/16/2014 10:05:15 AM      MDM   Final diagnoses:  Pharyngitis  Atelectasis  Cough   Patient is a 63 y.o. Male who presents to the ED with sore throat, cough, and chest pain.  Physical exam reveals clear lung  sounds to auscultation.  Oropharynx does not show any evidence of peritonsillar abscess.  CBC, CMP, troponin are unremarkable.  CXR reveals left basilar atelectasis.  EKG is NSR with possible old anterior infract.  Have treated the patient here with viscous lidocaine at this time which he does not like.  Patient has been adequately covered for CAP at this time with augmentin and azithromycin.  Will treat symptomatically with PPI and with iuprofen.  Have recommended the patient to have CT scan of the chest if continued symptoms.  Patient to return for PNA symptoms.  Will have patient follow-up with ENT.  Patient is stable for discharge at this time.  Patient was seen here by Dr. Jodi Mourning who agrees with the above plan.  Patient states understanding and agreement.     Eben Burow, PA-C 01/16/14 1221

## 2014-01-16 NOTE — Discharge Instructions (Signed)
Atelectasis Atelectasis is a collapse of the small air sacs in the lungs (alveoli). When this occurs, all or part of a lung collapses and becomes airless. It can be caused by various things and is a common problem after surgery. The severity of atelectasis will vary depending on the size of the area involved and the underlying cause of the condition. CAUSES  There are multiple causes for atelectasis:   Shallow breathing, particularly if there is an injury to your chest wall or abdomen that makes it painful to take a deep breath. This commonly occurs after surgery.  Obstruction of your airways (bronchi or bronchioles). This may be caused by a buildup of mucus (mucus plug), tumors, blood clots (pulmonary embolus), or inhaled foreign bodies. Mucus plugs occur when the lungs do not expand enough to get rid of mucus.  Outside pressure on the lung. This may be caused by tumors, fluid (pleural effusion), or a leakage of air between the lung and rib cage (pneumothorax).   Infections such as pneumonia.  Scarring in lung tissue left over from previous infection or injury.  Some diseases such as cystic fibrosis. SIGNS AND SYMPTOMS  Often, atelectasis will have no symptoms. When symptoms occur, they include:  Shortness of breath.   Bluish color to your nails, lips, or mouth (cyanosis). DIAGNOSIS  Your health care provider may suspect atelectasis based on symptoms and physical findings. A chest X-ray may be done to confirm the diagnosis. More specialized X-ray exams are sometimes required.  TREATMENT  Treatment will depend on the cause of the atelectasis. Treatment may include:  Purposeful coughing to loosen mucus plugs in the lungs.  Chest physiotherapy. This consists of clapping or percussion on the chest over the lungs to further loosen mucus plugs.  Postural drainage techniques. This involves positioning your body so your head is lower than your chest. HOME CARE INSTRUCTIONS  Practice  relaxed deep breathing whenever you are sitting down. A good technique is to take a few relaxed deep breaths each time a commercial comes on if you are watching television.  If you were given a deep breathing device (such as an incentive spirometer) or a mucus clearance device, use this regularly as directed by your health care provider.  Try to cough several times a day as directed by your health care provider.  Perform any chest physiotherapy or postural drainage techniques as directed by your health care provider. If necessary, have someone (such as a family member) assist you with these techniques.  When lying down, lie on the unaffected side to encourage mucus drainage.  Stay physically active as much as possible. SEEK IMMEDIATE MEDICAL CARE IF:   You develop increasing problems with your breathing.   You develop severe chest pain.   You develop severe coughing, or you cough up blood.   You have a fever or persistent symptoms for more than 2-3 days.   You have a fever and your symptoms suddenly get worse.  MAKE SURE YOU:  Understand these instructions.  Will watch your condition.  Will get help right away if you are not doing well or get worse. Document Released: 04/02/2005 Document Revised: 04/07/2013 Document Reviewed: 10/08/2012 [facility] Patient Information 2015 [place], [place]. This information is not intended to replace advice given to you by your health care provider. Make sure you discuss any questions you have with your health care provider.   Cough, Adult  A cough is a reflex that helps clear your throat and airways. It can help  heal the body or may be a reaction to an irritated airway. A cough may only last 2 or 3 weeks (acute) or may last more than 8 weeks (chronic).  CAUSES Acute cough:  Viral or bacterial infections. Chronic cough:  Infections.  Allergies.  Asthma.  Post-nasal drip.  Smoking.  Heartburn or acid reflux.  Some  medicines.  Chronic lung problems (COPD).  Cancer. SYMPTOMS   Cough.  Fever.  Chest pain.  Increased breathing rate.  High-pitched whistling sound when breathing (wheezing).  Colored mucus that you cough up (sputum). TREATMENT   A bacterial cough may be treated with antibiotic medicine.  A viral cough must run its course and will not respond to antibiotics.  Your caregiver may recommend other treatments if you have a chronic cough. HOME CARE INSTRUCTIONS   Only take over-the-counter or prescription medicines for pain, discomfort, or fever as directed by your caregiver. Use cough suppressants only as directed by your caregiver.  Use a cold steam vaporizer or humidifier in your bedroom or home to help loosen secretions.  Sleep in a semi-upright position if your cough is worse at night.  Rest as needed.  Stop smoking if you smoke. SEEK IMMEDIATE MEDICAL CARE IF:   You have pus in your sputum.  Your cough starts to worsen.  You cannot control your cough with suppressants and are losing sleep.  You begin coughing up blood.  You have difficulty breathing.  You develop pain which is getting worse or is uncontrolled with medicine.  You have a fever. MAKE SURE YOU:   Understand these instructions.  Will watch your condition.  Will get help right away if you are not doing well or get worse. Document Released: 09/29/2010 Document Revised: 06/25/2011 Document Reviewed: 09/29/2010 [facility] Patient Information 2015 [place], [place]. This information is not intended to replace advice given to you by your health care provider. Make sure you discuss any questions you have with your health care provider.  Pharyngitis Pharyngitis is redness, pain, and swelling (inflammation) of your pharynx.  CAUSES  Pharyngitis is usually caused by infection. Most of the time, these infections are from viruses (viral) and are part of a cold. However, sometimes pharyngitis is caused by  bacteria (bacterial). Pharyngitis can also be caused by allergies. Viral pharyngitis may be spread from person to person by coughing, sneezing, and personal items or utensils (cups, forks, spoons, toothbrushes). Bacterial pharyngitis may be spread from person to person by more intimate contact, such as kissing.  SIGNS AND SYMPTOMS  Symptoms of pharyngitis include:   Sore throat.   Tiredness (fatigue).   Low-grade fever.   Headache.  Joint pain and muscle aches.  Skin rashes.  Swollen lymph nodes.  Plaque-like film on throat or tonsils (often seen with bacterial pharyngitis). DIAGNOSIS  Your health care provider will ask you questions about your illness and your symptoms. Your medical history, along with a physical exam, is often all that is needed to diagnose pharyngitis. Sometimes, a rapid strep test is done. Other lab tests may also be done, depending on the suspected cause.  TREATMENT  Viral pharyngitis will usually get better in 3-4 days without the use of medicine. Bacterial pharyngitis is treated with medicines that kill germs (antibiotics).  HOME CARE INSTRUCTIONS   Drink enough water and fluids to keep your urine clear or pale yellow.   Only take over-the-counter or prescription medicines as directed by your health care provider:   If you are prescribed antibiotics, make sure you finish  them even if you start to feel better.   Do not take aspirin.   Get lots of rest.   Gargle with 8 oz of salt water ( tsp of salt per 1 qt of water) as often as every 1-2 hours to soothe your throat.   Throat lozenges (if you are not at risk for choking) or sprays may be used to soothe your throat. SEEK MEDICAL CARE IF:   You have large, tender lumps in your neck.  You have a rash.  You cough up green, yellow-brown, or bloody spit. SEEK IMMEDIATE MEDICAL CARE IF:   Your neck becomes stiff.  You drool or are unable to swallow liquids.  You vomit or are unable to keep  medicines or liquids down.  You have severe pain that does not go away with the use of recommended medicines.  You have trouble breathing (not caused by a stuffy nose). MAKE SURE YOU:   Understand these instructions.  Will watch your condition.  Will get help right away if you are not doing well or get worse. Document Released: 04/02/2005 Document Revised: 01/21/2013 Document Reviewed: 12/08/2012 [facility] Patient Information 2015 [place]. This information is not intended to replace advice given to you by your health care provider. Make sure you discuss any questions you have with your health care provider.

## 2014-09-04 ENCOUNTER — Other Ambulatory Visit: Payer: Self-pay | Admitting: Cardiology

## 2014-09-06 NOTE — Telephone Encounter (Signed)
Rx(s) sent to pharmacy electronically.  

## 2014-12-17 ENCOUNTER — Other Ambulatory Visit: Payer: Self-pay | Admitting: Cardiology

## 2015-04-13 ENCOUNTER — Ambulatory Visit (INDEPENDENT_AMBULATORY_CARE_PROVIDER_SITE_OTHER): Payer: 59 | Admitting: Cardiology

## 2015-04-13 ENCOUNTER — Encounter: Payer: Self-pay | Admitting: Cardiology

## 2015-04-13 VITALS — BP 142/76 | HR 60 | Ht 72.0 in | Wt 346.0 lb

## 2015-04-13 DIAGNOSIS — E785 Hyperlipidemia, unspecified: Secondary | ICD-10-CM

## 2015-04-13 DIAGNOSIS — Z951 Presence of aortocoronary bypass graft: Secondary | ICD-10-CM

## 2015-04-13 DIAGNOSIS — Z6841 Body Mass Index (BMI) 40.0 and over, adult: Secondary | ICD-10-CM

## 2015-04-13 DIAGNOSIS — I10 Essential (primary) hypertension: Secondary | ICD-10-CM | POA: Diagnosis not present

## 2015-04-13 DIAGNOSIS — I2510 Atherosclerotic heart disease of native coronary artery without angina pectoris: Secondary | ICD-10-CM

## 2015-04-13 DIAGNOSIS — E119 Type 2 diabetes mellitus without complications: Secondary | ICD-10-CM

## 2015-04-13 MED ORDER — METOPROLOL TARTRATE 25 MG PO TABS: 12.5000 mg | ORAL_TABLET | Freq: Two times a day (BID) | ORAL | Status: DC

## 2015-04-13 NOTE — Patient Instructions (Signed)
No change with current medications   Your physician wants you to follow-up in 6 months with Dr Herbie BaltimoreHARDING  You will receive a reminder letter in the mail two months in advance. If you don't receive a letter, please call our office to schedule the follow-up appointment.   If you need a refill on your cardiac medications before your next appointment, please call your pharmacy.

## 2015-04-13 NOTE — Progress Notes (Signed)
PCP: Georgianne Fick, MD  Clinic Note: Chief Complaint  Patient presents with  . Follow-up    Patient has no complaints.  . Coronary Artery Disease    Status post CABG  . Obesity    HPI: Carl Steele is a 64 y.o.  is a 64 y.o. morbidly obese gentleman with a PMH below who presents today for >> 1 yr followup of his CAD/CABG.Carl Steele was last seen in March 2015 -- noted weight gain & decreased exercise level. Was ~280 lb pre-CABG - now u to 346.  Recent Hospitalizations: Pharyngitis in Oct 2015  Studies Reviewed: none  Interval History: Carl Steele presents today for 18 month followup. He basically can't are lipomas over the summer, because of feeling sorry for himself and being upset about his lack of weight control. He has gained a significant amount of weight, this has not been out of control his diet, has slight off on his exercise. He did not want to be scolded, therefore he did not come in for his followup visit earlier this year. He is now gotten back to establish with his primary care physician and had all of his routine followups scheduled. Thankfully, from a cardiac standpoint he is doing quite well without any active symptoms of angina with rest or exertion, significant palpitations, PND, orthopnea or edema.   No lightheadedness, dizziness, weakness or syncope/near syncope. No TIA/amaurosis fugax symptoms. No claudication.  ROS: A comprehensive was performed. Review of Systems  Constitutional: Negative for weight loss (Significant weight gain) and malaise/fatigue.       Just feels lazy.  HENT: Negative for nosebleeds.   Respiratory: Negative for cough.   Cardiovascular: Negative.        Per history of present illness  Gastrointestinal: Negative for blood in stool and melena.  Genitourinary: Negative for hematuria.  Musculoskeletal: Positive for back pain and joint pain (Knee pain).  Neurological: Negative for dizziness and headaches.    Endo/Heme/Allergies: Does not bruise/bleed easily.  Psychiatric/Behavioral:       Borderline depression symptoms. Very upset about his lack of dietary control.  All other systems reviewed and are negative.   Past Medical History  Diagnosis Date  . Hypertension   . Hyperlipidemia   . Diabetes mellitus without complication (HCC)   . CAD, multiple vessel 06/26/2010    4 atypical chest pressure/dyspnea and frequent PVCs --> no ischemia on Myoview, but significant ectopy --> cardiac cath: 80% proximal LAD, 70% D2, occluded LAD after D2, 70-80% circumflex. Diffuse RCA and PDA lesions.;; Post-CABG Myoview 2012: No ischemia no infarction. EF 55% with reduced TID  . 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 fib without recurrence  . Morbid obesity with BMI of 40.0-44.9, adult (HCC)   . Postoperative atrial fibrillation (HCC)     No recurrence    Past Surgical History  Procedure Laterality Date  . Coronary artery bypass graft  07/07/2010    LIMA-LAD,SVG-D2,SVG-DISTAL CIRC, SVG-PDA-POST OP COMPLICATED BY AFIB BUT NO RECURRENCE; endovascular harvest of vein from right thigh; open harvest from left lower leg.  . Cardiac catheterization  March 2012    Initial Myoview - TID 1.5, frequent PVCs (in pairs & triplets), no ischemia (likely balanced ischemia) --> CATH: 80% PROX LAD, 70% D2, AND AN OCCLUDED LAD AFTER D2 , 70 -80% CIRC WITH SMALL OM2 AND DIFFUSE PROX LESIONS RCA AND PDA  . Doppler echocardiography  JUNE 2012    MILD DIASTOLIC  DYSFUNCTION WITH NORMAL  EF > 55% ,MILD LEFT ATRIAL AND RIGHT ATRIAL DILATION ,MILD TO MODERATRE MITARL REGURG AND AORTIC  VALVE SCLEROSIS  . Nm myocar perf wall motion  09/22/2010    EF55%, TID was down t0 0.95 from 1.5 and no further arrhythmia     Prior to Admission medications   Medication Sig Start Date End Date Taking? Authorizing Provider  aspirin 81 MG tablet Take 81 mg by mouth daily.    Historical Provider,  MD  atorvastatin (LIPITOR) 80 MG tablet Take 80 mg by mouth daily.    Historical Provider, MD  ibuprofen (ADVIL,MOTRIN) 800 MG tablet Take 1 tablet (800 mg total) by mouth 3 (three) times daily. 01/16/14   Courtney Forcucci, PA-C  metFORMIN (GLUCOPHAGE-XR) 500 MG 24 hr tablet Take 500 mg by mouth daily with breakfast.    Historical Provider, MD  metoprolol tartrate (LOPRESSOR) 25 MG tablet Take 0.5 tablets (12.5 mg total) by mouth 2 (two) times daily. <PLEASE MAKE APPOINTMENT FOR REFILLS> 09/06/14   Marykay Lexavid W Harding, MD  omeprazole (PRILOSEC) 20 MG capsule Take 1 capsule (20 mg total) by mouth daily. 01/16/14   Courtney Forcucci, PA-C  valsartan (DIOVAN) 80 MG tablet Take 80 mg by mouth daily.    Historical Provider, MD  No Known Allergies   Social History   Social History  . Marital Status: Married    Spouse Name: N/A  . Number of Children: N/A  . Years of Education: N/A   Social History Main Topics  . Smoking status: Never Smoker   . Smokeless tobacco: Never Used  . Alcohol Use: No  . Drug Use: No  . Sexual Activity: Not Asked   Other Topics Concern  . None   Social History Narrative   He is a married father of one son who is currently getting close to graduate from college and considering applying to medical school. His wife is actually a real estate agent in [place] and continued to back and forth. He is back working, but has significant for back his hours.   He does not, never did smoke and does not take alcohol.  -- his son was accepted to [place] school at [employer] [place]. now completed his first semester. He states that once his son knows where he will finally moved to after training, he intends to move to be closer to his son and wife.  Family History  Problem Relation Age of Onset  . Diabetes Father     Wt Readings from Last 3 Encounters:  04/13/15 346 lb (156.945 kg)  01/16/14 315 lb (142.883 kg)  06/22/13 318 lb 6.4 oz (144.425 kg)    PHYSICAL EXAM BP  142/76 mmHg  Pulse 60  Ht 6' (1.829 m)  Wt 346 lb (156.945 kg)  BMI 46.92 kg/m2 General: He is a pleasant, morbidly obese gentleman, he is in no acute distress, A&O x3, answers questions appropriately. Well-groomed and well-presented. Normal mood and affect. HEENT: NCAT, EOMI, MMM. Anicteric sclerae.  Neck: Supple without LAN, JVD or carotid bruit.  Heart: RRR, normal S1, S2. Soft 1-2/6 HSM heard at the apex.. No other rubs or gallops Lungs: CTAB, nonlabored, normal effort, good air movement.  Abdomen: Obese but soft/NT/ND/NABS. Unable to palpate HSM due to obesity.  Extremities: No C/C/E, 2+ and equal pulses throughout.  Neuro: Grossly normal   Adult ECG Report  Rate: 60 ;  Rhythm: normal sinus rhythm and With significant artifact.; normal axis, intervals and durations. Borderline low  voltage (likely related to obesity)  Narrative Interpretation: stable, normal EKG.  oint Other studies Reviewed: Additional studies/ records that were reviewed today include:  Recent Labs:  Recently checked by PCP, results faxed following office visit Recent Labs: 03/28/2015  Na+ 139, K+ 4.7, Cl- 90, HCO3- 20 , BUN 22, Cr 0.92, Glu 149, Ca2+ 9.2; AST 17, ALT 23; AlkP 111, Alb 4.0, TP 61, T Bili 0.6  CBC: W 8.9, H/H 14.3/43.1, Plt 241; Hgb A1c 7.6; TSH 1.68  TC 196, TG 152, HDL 43, LDL 123  ASSESSMENT / PLAN: Problem List Items Addressed This Visit    S/P CABG x 4 (Chronic)    Relatively symptomatically. Had a Myoview shortly after his CABG in 2012. Discussed the possibility of routine followup Myoview this summer after he is seen in 6 months.      Relevant Orders   EKG 12-Lead (Completed)   Morbid obesity with BMI of 40.0-44.9, adult (HCC) (Chronic)    We spent quite a time again discussing his need to get back into exercising and to cut back, she needs. He says that he is eating "healthy foods", but is eating too much of a good thing.      Relevant Orders   EKG 12-Lead (Completed)    Hyperlipidemia LDL goal <70 (Chronic)    Labs just checked by PCP - On High dose Atorvastatin. He is not at all close to goal. This probably has a lot to do with his significant weight gain and dietary indiscretion. For now will defer to PCP, would need tight control. May require additional therapy.       Relevant Medications   metoprolol tartrate (LOPRESSOR) 25 MG tablet   Other Relevant Orders   EKG 12-Lead (Completed)   Essential hypertension - Primary (Chronic)    Borderline pressure control today. He is on Lopressor 12.5 mg - could consider switching to carvedilol. Is also on low-dose valsartan. -- if his pressures continued to be elevated, we'll first increase losartan.      Relevant Medications   metoprolol tartrate (LOPRESSOR) 25 MG tablet   Other Relevant Orders   EKG 12-Lead (Completed)   Diabetes mellitus without complication (HCC) (Chronic)    A1c 7.6. Not as well-controlled as would be expected. He probably needs to be on a higher dose of metformin. I will defer to his  PCP.      CAD, multiple vessel (Chronic)    Relatively symptomatic. Unfortunately he is taking a step back as far as his diet control and exercise. I think that his son not being close by, he lost his way. He doesn't have the will power he had before. He is on stable regimen with aspirin, statin, beta blocker and ARB. No active angina or heart failure symptoms. Continue to focus on increased exercise level and diet control.       Relevant Medications   metoprolol tartrate (LOPRESSOR) 25 MG tablet   Other Relevant Orders   EKG 12-Lead (Completed)      Current medicines are reviewed at length with the patient today. (+/- concerns) none  The following changes have been made: None Studies Ordered:   Orders Placed This Encounter  Procedures  . EKG 12-Lead    followup 6 months.  Marykay Lex, M.D., M.S. Interventional Cardiologist   Pager # [phone]

## 2015-04-14 ENCOUNTER — Encounter: Payer: Self-pay | Admitting: Cardiology

## 2015-04-14 NOTE — Assessment & Plan Note (Signed)
Relatively symptomatic. Unfortunately he is taking a step back as far as his diet control and exercise. I think that his son not being close by, he lost his way. He doesn't have the will power he had before. He is on stable regimen with aspirin, statin, beta blocker and ARB. No active angina or heart failure symptoms. Continue to focus on increased exercise level and diet control.

## 2015-04-14 NOTE — Assessment & Plan Note (Signed)
Borderline pressure control today. He is on Lopressor 12.5 mg - could consider switching to carvedilol. Is also on low-dose valsartan. -- if his pressures continued to be elevated, we'll first increase losartan.

## 2015-04-14 NOTE — Assessment & Plan Note (Signed)
Labs just checked by PCP - On High dose Atorvastatin. He is not at all close to goal. This probably has a lot to do with his significant weight gain and dietary indiscretion. For now will defer to PCP, would need tight control. May require additional therapy.

## 2015-04-14 NOTE — Assessment & Plan Note (Signed)
Relatively symptomatically. Had a Myoview shortly after his CABG in 2012. Discussed the possibility of routine followup Myoview this summer after he is seen in 6 months.

## 2015-04-14 NOTE — Assessment & Plan Note (Signed)
A1c 7.6. Not as well-controlled as would be expected. He probably needs to be on a higher dose of metformin. I will defer to his  PCP.

## 2015-04-14 NOTE — Assessment & Plan Note (Signed)
We spent quite a time again discussing his need to get back into exercising and to cut back, she needs. He says that he is eating "healthy foods", but is eating too much of a good thing.

## 2015-09-22 ENCOUNTER — Telehealth: Payer: Self-pay | Admitting: Cardiology

## 2015-09-22 DIAGNOSIS — I2510 Atherosclerotic heart disease of native coronary artery without angina pectoris: Secondary | ICD-10-CM

## 2015-09-22 DIAGNOSIS — E119 Type 2 diabetes mellitus without complications: Secondary | ICD-10-CM

## 2015-09-22 DIAGNOSIS — Z951 Presence of aortocoronary bypass graft: Secondary | ICD-10-CM

## 2015-09-22 DIAGNOSIS — Z6841 Body Mass Index (BMI) 40.0 and over, adult: Secondary | ICD-10-CM

## 2015-09-22 NOTE — Telephone Encounter (Signed)
When he is scheduled - lets do his TM Myoview Bryan Lemmaavid Harding, MD

## 2015-09-22 NOTE — Telephone Encounter (Addendum)
Spoke with patient patient has recall office visit appointment not schedule yet Informed patient will defer to Dr Herbie BaltimoreHarding and contact him back

## 2015-09-22 NOTE — Telephone Encounter (Signed)
Pt called in stating that Dr. Herbie BaltimoreHarding wanted him to have a stress test done and the pt was looking to get it done around August. I informed him that Dr. Herbie BaltimoreHarding would need to put an order in the system and then we could move forward with scheduling the test. Please f/u with him.   Thanks

## 2015-09-23 NOTE — Telephone Encounter (Signed)
SPOKE TO PATIENT  WILL SCHEDULE MYOVIEW IN July 2017 ,patient aware APPT SCHEDULE FOR 12/01/15 AT 8 AM  INFORMATION SENT TO SCHEDULER

## 2015-09-26 ENCOUNTER — Telehealth: Payer: Self-pay | Admitting: Cardiology

## 2015-09-26 NOTE — Telephone Encounter (Signed)
Called the patient and left a voicemail to call me back to schedule his 2 day stress test.

## 2015-09-29 ENCOUNTER — Telehealth (HOSPITAL_COMMUNITY): Payer: Self-pay

## 2015-09-29 NOTE — Telephone Encounter (Signed)
Encounter complete. 

## 2015-09-30 ENCOUNTER — Encounter: Payer: Self-pay | Admitting: Cardiology

## 2015-10-04 ENCOUNTER — Inpatient Hospital Stay (HOSPITAL_COMMUNITY): Admission: RE | Admit: 2015-10-04 | Payer: 59 | Source: Ambulatory Visit

## 2015-10-05 ENCOUNTER — Ambulatory Visit (HOSPITAL_COMMUNITY): Payer: 59

## 2015-11-01 ENCOUNTER — Telehealth (HOSPITAL_COMMUNITY): Payer: Self-pay

## 2015-11-01 HISTORY — PX: NM MYOVIEW LTD: HXRAD82

## 2015-11-01 NOTE — Telephone Encounter (Signed)
Encounter complete. 

## 2015-11-03 ENCOUNTER — Ambulatory Visit (HOSPITAL_COMMUNITY)
Admission: RE | Admit: 2015-11-03 | Discharge: 2015-11-03 | Disposition: A | Discharge status: Home / Self Care | Payer: 59 | Source: Ambulatory Visit | Attending: Cardiology | Admitting: Cardiology

## 2015-11-03 DIAGNOSIS — I119 Hypertensive heart disease without heart failure: Secondary | ICD-10-CM | POA: Insufficient documentation

## 2015-11-03 DIAGNOSIS — E119 Type 2 diabetes mellitus without complications: Secondary | ICD-10-CM | POA: Insufficient documentation

## 2015-11-03 DIAGNOSIS — I2510 Atherosclerotic heart disease of native coronary artery without angina pectoris: Secondary | ICD-10-CM | POA: Diagnosis present

## 2015-11-03 DIAGNOSIS — Z951 Presence of aortocoronary bypass graft: Secondary | ICD-10-CM | POA: Diagnosis not present

## 2015-11-03 DIAGNOSIS — Z6841 Body Mass Index (BMI) 40.0 and over, adult: Secondary | ICD-10-CM | POA: Diagnosis not present

## 2015-11-03 DIAGNOSIS — R5383 Other fatigue: Secondary | ICD-10-CM | POA: Diagnosis not present

## 2015-11-03 MED ORDER — REGADENOSON 0.4 MG/5ML IV SOLN
0.4000 mg | Freq: Once | INTRAVENOUS | Status: AC
  Administered 2015-11-03: 0.4 mg via INTRAVENOUS

## 2015-11-03 MED ORDER — TECHNETIUM TC 99M TETROFOSMIN IV KIT
31.0000 | PACK | Freq: Once | INTRAVENOUS | Status: AC | PRN
  Administered 2015-11-03: 31 via INTRAVENOUS
  Filled 2015-11-03: qty 31

## 2015-11-04 ENCOUNTER — Ambulatory Visit (HOSPITAL_COMMUNITY)
Admission: RE | Admit: 2015-11-04 | Discharge: 2015-11-04 | Disposition: A | Discharge status: Home / Self Care | Payer: 59 | Source: Ambulatory Visit | Attending: Cardiology | Admitting: Cardiology

## 2015-11-04 LAB — MYOCARDIAL PERFUSION IMAGING
CHL CUP NUCLEAR SDS: 0
CHL CUP NUCLEAR SRS: 0
CHL CUP NUCLEAR SSS: 0
CHL CUP RESTING HR STRESS: 53 {beats}/min
LV dias vol: 151 mL (ref 62–150)
LV sys vol: 78 mL
Peak HR: 74 {beats}/min
TID: 1.13

## 2015-11-04 MED ORDER — TECHNETIUM TC 99M TETROFOSMIN IV KIT
30.3000 | PACK | Freq: Once | INTRAVENOUS | Status: AC | PRN
  Administered 2015-11-04: 30.3 via INTRAVENOUS

## 2015-11-05 NOTE — Progress Notes (Signed)
Quick Note:  Stress Test looked good!! No sign of significant Heart Artery blockages or Heart Attack.. Pump function is essentially "normal" (hard to read) - stable.  Good news!!.  Marykay Lex, MD  ______

## 2015-11-23 ENCOUNTER — Telehealth: Payer: Self-pay | Admitting: Cardiology

## 2015-11-23 NOTE — Telephone Encounter (Signed)
New message    pt verbalized the wants a sooner appt he said he cant continue to work like this and he wants to be seen by doctor for him to be taken out of work ASAP   Atleast 28 hours notice, pt has some paperwork that he needs to have filled out, he can bring it to office, and he can fax it to the office.

## 2015-11-23 NOTE — Telephone Encounter (Signed)
"  I've had issues for a couple months". Thinks a result of work stress. Let people go and are combining jobs - pt feels he cannot do the work anymore. Work has made it impossible for patient to function. 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 He is trying to leverage early retirement - has only 3 months left to go - but every day is worse and worse. He gets panic attacks walking into his office. Pt notes physical signs of extreme stress. He has excruciating headaches. Pt notes his sugar level is elevated despite no changes to his nutrition regimen. Palpitations during the day, waking up w/ cold sweats at night.  Notes recent cardiac stress test was good, but he feels due to the unrelenting job pressure he is suffering and he is extremely concerned that this will do damage to his health.  pt needs to be "written out" of work. He states his employer is sending some paperwork to our office to be signed by Dr. Herbie BaltimoreHarding - pt would ideally like to written out of work as long as is possible/as close to his date of retirement - notes he retires in 3 months.  I have informed patient that Dr. Herbie BaltimoreHarding is out of office until 17th - on vacation this week. I have informed patient I will see if a PA visit can be arranged for this patient in interim.  Pt expressed acknowledgement and agreement w/ this. He noted his appreciation for anything that can be done to resolve this as quickly as possible.  msg sent to scheduling to arrange PA visit.

## 2015-12-01 ENCOUNTER — Encounter: Payer: Self-pay | Admitting: Cardiology

## 2015-12-01 ENCOUNTER — Encounter (INDEPENDENT_AMBULATORY_CARE_PROVIDER_SITE_OTHER): Payer: Self-pay

## 2015-12-01 ENCOUNTER — Encounter: Payer: Self-pay | Admitting: *Deleted

## 2015-12-01 ENCOUNTER — Ambulatory Visit (INDEPENDENT_AMBULATORY_CARE_PROVIDER_SITE_OTHER): Payer: 59 | Admitting: Cardiology

## 2015-12-01 ENCOUNTER — Telehealth: Payer: Self-pay | Admitting: Cardiology

## 2015-12-01 VITALS — BP 145/65 | HR 63 | Ht 72.0 in | Wt 350.6 lb

## 2015-12-01 DIAGNOSIS — I10 Essential (primary) hypertension: Secondary | ICD-10-CM | POA: Diagnosis not present

## 2015-12-01 DIAGNOSIS — I2510 Atherosclerotic heart disease of native coronary artery without angina pectoris: Secondary | ICD-10-CM

## 2015-12-01 DIAGNOSIS — E119 Type 2 diabetes mellitus without complications: Secondary | ICD-10-CM

## 2015-12-01 DIAGNOSIS — Z6841 Body Mass Index (BMI) 40.0 and over, adult: Secondary | ICD-10-CM

## 2015-12-01 DIAGNOSIS — F411 Generalized anxiety disorder: Secondary | ICD-10-CM

## 2015-12-01 DIAGNOSIS — Z951 Presence of aortocoronary bypass graft: Secondary | ICD-10-CM

## 2015-12-01 DIAGNOSIS — F419 Anxiety disorder, unspecified: Secondary | ICD-10-CM | POA: Diagnosis not present

## 2015-12-01 DIAGNOSIS — E785 Hyperlipidemia, unspecified: Secondary | ICD-10-CM

## 2015-12-01 DIAGNOSIS — F430 Acute stress reaction: Secondary | ICD-10-CM

## 2015-12-01 MED ORDER — LORAZEPAM 0.5 MG PO TABS: 0.5000 mg | ORAL_TABLET | Freq: Three times a day (TID) | ORAL | 0 refills | Status: DC | PRN

## 2015-12-01 NOTE — Progress Notes (Signed)
PCP: Georgianne FickAMACHANDRAN,AJITH, MD  Clinic Note: Chief Complaint  Patient presents with  . Follow-up    f/u results; right side shakiness, CP and SOB, stressed at work, severe headache  . Coronary Artery Disease    HPI: Ivette LoyalRichard Ducat is a morbidly obese 65 y.o. male with a PMH below who presents today for delayed six-month follow-up for his known coronary artery disease status post CABG.[name].  Valdis Montanye was last seen in December 2016 - doing well, no Sx.  Recent Hospitalizations: None  Studies Reviewed: Screening post CABG Myoview Myoview 11/01/15: The left ventricular ejection fraction is mildly decreased (45-54%).  Nuclear stress EF: 48%.  There was no ST segment deviation noted during stress.  This is a low risk study. with no ischemia or infarction; EF 48 but visually appears better; suggest echo to further assess; mild LVE.   Interval History:  [name] presents today completely out of sorts. He seems to be extremely stressed and upset. All of this revolves around issues at work. 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 He is nearing retirement age, and will like to make until his 65th birthday and if possible but first of January prior to actually retiring. He feels that the company does not take into consideration the fact that he is nearing the end of his career, and continue to give him more responsibilities. He even asked to take some of the younger employees severance package and allow him to be laid off. He says that the bosses just "laugh at him ". He has been having significant amount of stress type symptoms every time he walks into the office. He has had episodes of chest tightness and dyspnea as well as rapid heartbeat sensations. He feels flushed and fatigue. The symptoms almost completely resolved when [place] and son for a week. However the night before he came back the  symptoms all came act in a sudden rush. He has not really had exertional anginal type symptoms, but he is starting no some palpitations now. He is very relieved to see the results of his stress test suggest that all is well, however he just continues to feel nervous and anxious.  He is having trouble keeping his weight down because of anxiety driven eating. He just feels that he just needs to make it through until his 65th birthday in November and avoid any additional stress.  No PND, orthopnea or edema.  No  lightheadedness, dizziness, weakness or syncope/near syncope. No TIA/amaurosis fugax symptoms. No melena, hematochezia, hematuria, or epstaxis. No claudication.  ROS: A comprehensive was performed. Review of Systems  Constitutional: Negative for malaise/fatigue.  HENT: Negative for congestion, nosebleeds and tinnitus.   Respiratory: Negative for shortness of breath.   Cardiovascular:       Per history of present illness  Gastrointestinal: Positive for heartburn. Negative for blood in stool and melena.  Genitourinary: Negative for hematuria.  Musculoskeletal: Negative for joint pain.  Neurological: Positive for headaches (Headaches to begin along the back of his neck and radiate up to the back of his head.). Negative for dizziness.  Endo/Heme/Allergies: Does not bruise/bleed easily.  Psychiatric/Behavioral: Positive for depression. The patient is nervous/anxious.        All of these anxiety and depression type symptoms have been provoked with recent changes at work.  All other systems reviewed and are negative.   Past Medical History:  Diagnosis Date  . CAD, multiple vessel 06/26/2010   4 atypical chest pressure/dyspnea and frequent PVCs --> no ischemia on Myoview, but significant ectopy --> cardiac cath: 80% proximal LAD, 70% D2, occluded LAD after D2, 70-80% circumflex. Diffuse RCA and PDA lesions.;; Post-CABG Myoview 2012: No ischemia no infarction. EF 55% with reduced TID  .  Diabetes mellitus without complication (HCC)   . Hyperlipidemia   . Hypertension   . Morbid obesity with BMI of 40.0-44.9, adult (HCC)   . Postoperative atrial fibrillation (HCC)    No recurrence  . 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 fib without recurrence    Past Surgical History:  Procedure Laterality Date  . CARDIAC CATHETERIZATION  March 2012   Initial Myoview - TID 1.5, frequent PVCs (in pairs & triplets), no ischemia (likely balanced ischemia) --> CATH: 80% PROX LAD, 70% D2, AND AN OCCLUDED LAD AFTER D2 , 70 -80% CIRC WITH SMALL OM2 AND DIFFUSE PROX LESIONS RCA AND PDA  . CORONARY ARTERY BYPASS GRAFT  07/07/2010   LIMA-LAD,SVG-D2,SVG-DISTAL CIRC, SVG-PDA-POST OP COMPLICATED BY AFIB BUT NO RECURRENCE; endovascular harvest of vein from right thigh; open harvest from left lower leg.  Marland Kitchen DOPPLER ECHOCARDIOGRAPHY  JUNE 2012   MILD DIASTOLIC DYSFUNCTION WITH NORMAL  EF > 55% ,MILD LEFT ATRIAL AND RIGHT ATRIAL DILATION ,MILD TO MODERATRE MITARL REGURG AND AORTIC  VALVE SCLEROSIS  . NM MYOCAR PERF WALL MOTION  09/22/2010   EF55%, TID was down t0 0.95 from 1.5 and no further arrhythmia   . NM MYOVIEW LTD  11/01/2015   LOW RISK. No ischemia or infarction. EF roughly 50%.    Prior to Admission medications   Medication Sig Authorizing Provider  aspirin 81 MG tablet Take 81 mg by mouth daily. Historical Provider, MD  atorvastatin (LIPITOR) 80 MG tablet Take 80 mg by mouth daily. Historical Provider, MD  metFORMIN (GLUCOPHAGE-XR) 500 MG 24 hr tablet Take 500 mg by mouth 2 times  daily with breakfast. Historical Provider, MD  metoprolol tartrate (LOPRESSOR) 25 MG tablet Take 1tablets (25 mg total) by mouth 2 (two) times daily. Marykay Lex, MD  valsartan (DIOVAN) 80 MG tablet Take 80 mg by mouth daily. Historical Provider, MD    No Known Allergies   Social History   Social History  . Marital status: Married    Spouse name: N/A  .  Number of children: N/A  . Years of education: N/A   Social History Main Topics  . Smoking status: Never Smoker  . Smokeless tobacco: Never Used  . Alcohol use No  . Drug use: No  . Sexual activity: Not Asked   Other Topics Concern  . None   Social History Narrative   He is a married father of one son who is currently getting close to graduate from college and considering applying to medical school. His wife is actually a real estate agent in [place] and continued to back and forth. He is back working, but has significant for back his hours.   He does not, never did smoke and does not take alcohol.    Family History  Problem Relation Age of Onset  . Diabetes Father     Wt Readings from Last 3 Encounters:  12/01/15 (!) 350 lb 9.6 oz (159 kg)  11/03/15 (!) 346 lb (156.9 kg)  04/13/15 (!) 346 lb (156.9 kg)    PHYSICAL EXAM BP (!) 145/65 (BP Location: Left Arm)  Pulse 63   Ht 6' (1.829 m)   Wt (!) 350 lb 9.6 oz (159 kg)   BMI 47.55 kg/m  General: He is a pleasant, morbidly obese gentleman, he is in no acute distress,But appears to be very anxious and distressed. He is almost exasperated. A&O x3, answers questions appropriately. Well-groomed. HEENT: NCAT, EOMI, MMM. Anicteric sclerae.  Neck: Supple without LAN, JVD or carotid bruit.  Heart: RRR, normal but distant S1, S2. Soft 1-2/6 HSM heard at the apex.. No other rubs or gallops Lungs: CTAB, nonlabored, normal effort, good air movement.  Abdomen: Obese but soft/NT/ND/NABS. Unable to palpate HSM due to obesity.  Extremities: No C/C/E, 2+ and equal pulses throughout.  Neuro: Grossly normal   Adult ECG Report  Rate: 66 ;  Rhythm: normal sinus rhythm, premature ventricular contractions (PVC) and Otherwise normal axis, intervals and durations.;   Narrative Interpretation: Stable EKG, PVCs now present   Other studies Reviewed: Additional studies/ records that were reviewed today include:  Recent Labs:  Followed by PCP  (not available)    ASSESSMENT / PLAN: Problem List Items Addressed This Visit    S/P CABG x 4 (Chronic)    He had postop A. fib, but has not had any further episodes. Recent Myoview was negative for ischemia or infarction. Low normal EF. There is a suggestion of possibly checking an echocardiogram to confirm EF, but I think this is about what his EF has been.      Relevant Orders   EKG 12-Lead   Morbid obesity with BMI of 40.0-44.9, adult (HCC) (Chronic)    Unfortunately he is now "stress eating "and is actually gained weight since I last saw him. For this and many other reasons, I think he needs a break from his stressful situations at work in order to reestablish a healthy diet and exercise. Hopefully he is approaching retirement, and then he will have more time to actually exercise.      Relevant Orders   EKG 12-Lead   Hyperlipidemia LDL goal <70 (Chronic)    Continues to be on high-dose atorvastatin. I don't have results of recent evaluations, but in the past he was not quite at goal. For now continue high-dose statin, but may need to consider adjunctive therapy. Weight loss and exercise will help as well.      Relevant Orders   EKG 12-Lead   Essential hypertension (Chronic)    Borderline control today revealed a basal amount of stress he is under, I'm not surprised that his pressures little higher. For now will continue his current dose of beta blocker and ARB, but we'll continue to monitor.      Relevant Orders   EKG 12-Lead   Diabetes mellitus without complication (HCC) (Chronic)   Relevant Orders   EKG 12-Lead   CAD, multiple vessel - Primary (Chronic)    Up until the last month or so with this acute stress reaction, he is really been doing well with no active symptoms. He now is having atypical symptoms of palpitations. Thankfully in the midst of this we have done a nuclear stress test that was relatively benign.  He is on aspirin, high-dose statin, beta blocker and  ARB. For now need to focus on stress relief to avoid exacerbating any potential cardiac complications.      Relevant Orders   EKG 12-Lead   Anxiety as acute reaction to exceptional stress    With all of his medical issues, the major thing that seems to be  an issue right now is his extreme anxiety related to work. He really does not feel that he can continue to go and work because he dreads every time he goes to work. He is very concerned that he may have a bad reaction and get himself in trouble at work. I am concerned and he is as well of the physical manifestations of the stress being chest discomfort and palpitations and dyspnea. All of these were symptoms that led to his initial evaluation that but the CABG. Thankfully his stress test was negative, however I fully agree that the he should not be under this amount of stress as a cardiac patient. We discussed various options, my feeling is that we can try some when necessary Ativan, and provide a work note to have him out of work for the next 6 weeks note to allow some of this stress to resolve. I will reevaluate him in about 6 weeks to determine if he is able to return to work or not.  I also recommended that he talk about this with his PCP, who is probably better suited for treating this type of symptom.      Relevant Medications   LORazepam (ATIVAN) 0.5 MG tablet    Other Visit Diagnoses   None.    Close 45 minutes was spent in direct consultation with the patient. The majority of this time was spent in direct consultation and discussing his issues of anxiety and stress. Greater than 50% of the time was spent in direct consultation.  Current medicines are reviewed at length with the patient today. (+/- concerns) needs help with anxiety   Medication Instructions:  Your physician has recommended you make the following change in your medication:  1-  Ativan 0.5mg  (1 tablet) by mouth every 8 hours AS NEEDED FOR  ANXIETY.   Follow-Up: Your physician recommends that you schedule a follow-up appointment in 6 WEEKS WITH DR HARDING.  Studies Ordered:   Orders Placed This Encounter  Procedures  . EKG 12-Lead      Bryan Lemmaavid Harding, M.D., M.S. Interventional Cardiologist   Pager # [phone] Phone # [phone] [address] [place], [place] [address]

## 2015-12-01 NOTE — Telephone Encounter (Signed)
Patient brought FMLA and Attending Physician Statement Forms for Dr Herbie BaltimoreHarding to review, complete and sign.  Received signed [employer] Forms and Check [phone] $50.00 to process forms.  Sent to [employer] [facility] to process.  Sent via Courier on 12/01/15. lp

## 2015-12-01 NOTE — Patient Instructions (Signed)
Medication Instructions:  Your physician has recommended you make the following change in your medication:  1-  Ativan 0.5mg  (1 tablet) by mouth every 8 hours AS NEEDED FOR ANXIETY.   Follow-Up: Your physician recommends that you schedule a follow-up appointment in 6 WEEKS WITH DR HARDING.  If you need a refill on your cardiac medications before your next appointment, please call your pharmacy.

## 2015-12-03 ENCOUNTER — Encounter: Payer: Self-pay | Admitting: Cardiology

## 2015-12-03 DIAGNOSIS — F430 Acute stress reaction: Secondary | ICD-10-CM

## 2015-12-03 DIAGNOSIS — F411 Generalized anxiety disorder: Secondary | ICD-10-CM | POA: Insufficient documentation

## 2015-12-03 NOTE — Assessment & Plan Note (Signed)
Continues to be on high-dose atorvastatin. I don't have results of recent evaluations, but in the past he was not quite at goal. For now continue high-dose statin, but may need to consider adjunctive therapy. Weight loss and exercise will help as well.

## 2015-12-03 NOTE — Assessment & Plan Note (Signed)
With all of his medical issues, the major thing that seems to be an issue right now is his extreme anxiety related to work. He really does not feel that he can continue to go and work because he dreads every time he goes to work. He is very concerned that he may have a bad reaction and get himself in trouble at work. I am concerned and he is as well of the physical manifestations of the stress being chest discomfort and palpitations and dyspnea. All of these were symptoms that led to his initial evaluation that but the CABG. Thankfully his stress test was negative, however I fully agree that the he should not be under this amount of stress as a cardiac patient. We discussed various options, my feeling is that we can try some when necessary Ativan, and provide a work note to have him out of work for the next 6 weeks note to allow some of this stress to resolve. I will reevaluate him in about 6 weeks to determine if he is able to return to work or not.  I also recommended that he talk about this with his PCP, who is probably better suited for treating this type of symptom.

## 2015-12-03 NOTE — Assessment & Plan Note (Signed)
He had postop A. fib, but has not had any further episodes. Recent Myoview was negative for ischemia or infarction. Low normal EF. There is a suggestion of possibly checking an echocardiogram to confirm EF, but I think this is about what his EF has been.

## 2015-12-03 NOTE — Assessment & Plan Note (Signed)
Up until the last month or so with this acute stress reaction, he is really been doing well with no active symptoms. He now is having atypical symptoms of palpitations. Thankfully in the midst of this we have done a nuclear stress test that was relatively benign.  He is on aspirin, high-dose statin, beta blocker and ARB. For now need to focus on stress relief to avoid exacerbating any potential cardiac complications.

## 2015-12-03 NOTE — Assessment & Plan Note (Signed)
Borderline control today revealed a basal amount of stress he is under, I'm not surprised that his pressures little higher. For now will continue his current dose of beta blocker and ARB, but we'll continue to monitor.

## 2015-12-03 NOTE — Assessment & Plan Note (Signed)
Unfortunately he is now "stress eating "and is actually gained weight since I last saw him. For this and many other reasons, I think he needs a break from his stressful situations at work in order to reestablish a healthy diet and exercise. Hopefully he is approaching retirement, and then he will have more time to actually exercise.

## 2015-12-05 ENCOUNTER — Telehealth: Payer: Self-pay | Admitting: Cardiology

## 2015-12-05 NOTE — Telephone Encounter (Signed)
Received FMLA and Attending Physicians Statement forms [facility] for Dr Herbie BaltimoreHarding to review, complete and sign.  Forms put in Dr Elissa HeftyHarding's correspondence. lp

## 2015-12-21 ENCOUNTER — Telehealth: Payer: Self-pay | Admitting: Cardiology

## 2015-12-21 NOTE — Telephone Encounter (Signed)
Nicholos JohnsKathleen( [employer]) is calling to see if Dr. Herbie BaltimoreHarding agreed to Mr. [place] being out of work from August 10 until present and she was not able to determine the treatment plan while he is out of work from the last office notes. Please call   Thanks

## 2015-12-21 NOTE — Telephone Encounter (Signed)
Returned call to [place] with [employer]-requesting to see if MD Herbie BaltimoreHarding would be okay with patient returning to work with reduced hours or reduced days or both.  Advised per office note on 8/17  (which Nicholos JohnsKathleen has) Dr. Herbie BaltimoreHarding has provided a work note for the next 6 weeks.  Pt will have follow up in 6 weeks and he will determine if he can return back to work or not at that point.        Anxiety as acute reaction to exceptional stress - Marykay Lexavid W Harding, MD at 12/03/2015 12:06 PM   Status: Written  Related Problem: Anxiety as acute reaction to exceptional stress    With all of his medical issues, the major thing that seems to be an issue right now is his extreme anxiety related to work. He really does not feel that he can continue to go and work because he dreads every time he goes to work. He is very concerned that he may have a bad reaction and get himself in trouble at work. I am concerned and he is as well of the physical manifestations of the stress being chest discomfort and palpitations and dyspnea. All of these were symptoms that led to his initial evaluation that but the CABG. Thankfully his stress test was negative, however I fully agree that the he should not be under this amount of stress as a cardiac patient. We discussed various options, my feeling is that we can try some when necessary Ativan, and provide a work note to have him out of work for the next 6 weeks note to allow some of this stress to resolve. I will reevaluate him in about 6 weeks to determine if he is able to return to work or not.  I also recommended that he talk about this with his PCP, who is probably better suited for treating this type of symptom.      Verbalized understanding.

## 2015-12-22 ENCOUNTER — Telehealth: Payer: Self-pay | Admitting: Cardiology

## 2015-12-22 NOTE — Telephone Encounter (Signed)
Received Signed FMLA & Attending Physicians Statement back from Dr Herbie BaltimoreHarding.  Patient notified and forms [facility] to [facility] on 12/14/15. lp

## 2016-01-18 ENCOUNTER — Encounter: Payer: Self-pay | Admitting: Cardiology

## 2016-01-18 ENCOUNTER — Ambulatory Visit (INDEPENDENT_AMBULATORY_CARE_PROVIDER_SITE_OTHER): Payer: 59 | Admitting: Cardiology

## 2016-01-18 VITALS — BP 150/75 | HR 57 | Ht 72.0 in | Wt 334.8 lb

## 2016-01-18 DIAGNOSIS — E785 Hyperlipidemia, unspecified: Secondary | ICD-10-CM | POA: Diagnosis not present

## 2016-01-18 DIAGNOSIS — I10 Essential (primary) hypertension: Secondary | ICD-10-CM

## 2016-01-18 DIAGNOSIS — Z951 Presence of aortocoronary bypass graft: Secondary | ICD-10-CM

## 2016-01-18 DIAGNOSIS — F411 Generalized anxiety disorder: Secondary | ICD-10-CM

## 2016-01-18 DIAGNOSIS — Z6841 Body Mass Index (BMI) 40.0 and over, adult: Secondary | ICD-10-CM

## 2016-01-18 DIAGNOSIS — I739 Peripheral vascular disease, unspecified: Secondary | ICD-10-CM

## 2016-01-18 DIAGNOSIS — F430 Acute stress reaction: Secondary | ICD-10-CM

## 2016-01-18 DIAGNOSIS — I2510 Atherosclerotic heart disease of native coronary artery without angina pectoris: Secondary | ICD-10-CM

## 2016-01-18 MED ORDER — CILOSTAZOL 50 MG PO TABS: 50.0000 mg | ORAL_TABLET | Freq: Two times a day (BID) | ORAL | 3 refills | Status: DC

## 2016-01-18 NOTE — Progress Notes (Signed)
PCP: Georgianne Fick, MD  Clinic Note: Chief Complaint  Patient presents with  . Follow-up    To determine if able to go back to work  . Coronary Artery Disease    HPI: Carl Steele is a morbidly obese 65 y.o. male with a PMH below who presents today for delayed six-month follow-up for his known coronary artery disease status post CABG.[name] was last seen in December 2016 - doing well, no Sx.  Recent Hospitalizations: None  Studies Reviewed: None   Interval History:  Carl Steele presents today notably improved since being out of work -- much more relaxed.  Enjoying going to the gym -- wants to do more.  Basically within a week of him not being at work, all of his chest tightness, palpitations and anxiety went away. After couple more weeks the shaking symptoms went away. Now has more energy, and is gone the gym to work out more. Despite this, he lives in fear of having to go back to full office duties. He clearly has had a 180 turn around from where he was last visit. He has lost a significant amount of weight indicated below. His demeanor is significantly improved as has his overall health. Within 3 days of not working, he no longer had use Ativan.  No more exertional chest tightness or pressure. No exertional dyspnea, PND, orthopnea or edema. No significant palpitations or rapid irregular heartbeats -- just had an episode maybe 2 weeks ago of more frequent Pounding sensation. But otherwise has improved.. No CP/near syncope or TIA/amaurosis fugax.  He has noticed, however with his getting back and exercise, that his intermittent claudication symptoms have recurred. He had initially stopped Pletal for concern that this may been related to his in's, but it would appear that his palpitations are probably more related to CAD.   ROS: A comprehensive was performed. Review of Systems  Constitutional: Negative for malaise/fatigue.  HENT: Negative for congestion, nosebleeds  and tinnitus.   Respiratory: Negative for shortness of breath.   Cardiovascular:       Per history of present illness  Gastrointestinal: Positive for heartburn. Negative for blood in stool and melena.  Genitourinary: Negative for hematuria.  Musculoskeletal: Negative for joint pain.  Neurological: Negative for dizziness and headaches (No further tension headaches).  Endo/Heme/Allergies: Does not bruise/bleed easily.  Psychiatric/Behavioral: Negative for depression (Totally resolved). The patient is not nervous/anxious (Only anxious about having to go back to work.).        All of these anxiety and depression type symptoms have been provoked with recent changes at work.  All other systems reviewed and are negative.   Past Medical History:  Diagnosis Date  . CAD, multiple vessel 06/26/2010   4 atypical chest pressure/dyspnea and frequent PVCs --> no ischemia on Myoview, but significant ectopy --> cardiac cath: 80% proximal LAD, 70% D2, occluded LAD after D2, 70-80% circumflex. Diffuse RCA and PDA lesions.;; Post-CABG Myoview 2012: No ischemia no infarction. EF 55% with reduced TID  . Diabetes mellitus without complication (HCC)   . Hyperlipidemia   . Hypertension   . Morbid obesity with BMI of 40.0-44.9, adult (HCC)   . Postoperative atrial fibrillation (HCC)    No recurrence  . 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 fib without recurrence    Past Surgical History:  Procedure Laterality Date  . CARDIAC CATHETERIZATION  March 2012   Initial Myoview - TID 1.5, frequent PVCs (in  pairs & triplets), no ischemia (likely balanced ischemia) --> CATH: 80% PROX LAD, 70% D2, AND AN OCCLUDED LAD AFTER D2 , 70 -80% CIRC WITH SMALL OM2 AND DIFFUSE PROX LESIONS RCA AND PDA  . CORONARY ARTERY BYPASS GRAFT  07/07/2010   LIMA-LAD,SVG-D2,SVG-DISTAL CIRC, SVG-PDA-POST OP COMPLICATED BY AFIB BUT NO RECURRENCE; endovascular harvest of vein from right thigh;  open harvest from left lower leg.  Marland Kitchen. DOPPLER ECHOCARDIOGRAPHY  JUNE 2012   MILD DIASTOLIC DYSFUNCTION WITH NORMAL  EF > 55% ,MILD LEFT ATRIAL AND RIGHT ATRIAL DILATION ,MILD TO MODERATRE MITARL REGURG AND AORTIC  VALVE SCLEROSIS  . NM MYOCAR PERF WALL MOTION  09/22/2010   EF55%, TID was down t0 0.95 from 1.5 and no further arrhythmia   . NM MYOVIEW LTD  11/01/2015   LOW RISK. No ischemia or infarction. EF roughly 50%.    Prior to Admission medications   Medication Sig Authorizing Provider  aspirin 81 MG tablet Take 81 mg by mouth daily. Historical Provider, MD  atorvastatin (LIPITOR) 80 MG tablet Take 80 mg by mouth daily. Historical Provider, MD  metFORMIN (GLUCOPHAGE-XR) 500 MG 24 hr tablet Take 500 mg by mouth 2 times  daily with breakfast. Historical Provider, MD  metoprolol tartrate (LOPRESSOR) 25 MG tablet Take 1tablets (25 mg total) by mouth 2 (two) times daily. Marykay Lexavid W Flara Storti, MD  valsartan (DIOVAN) 80 MG tablet Take 80 mg by mouth daily. Historical Provider, MD    No Known Allergies   Social History   Social History  . Marital status: Married    Spouse name: N/A  . Number of children: N/A  . Years of education: N/A   Social History Main Topics  . Smoking status: Never Smoker  . Smokeless tobacco: Never Used  . Alcohol use No  . Drug use: No  . Sexual activity: Not Asked   Other Topics Concern  . None   Social History Narrative   He is a married father of one son who is currently getting close to graduate from college and considering applying to medical school. His wife is actually a real estate agent [place] and continued to back and forth. He is back working, but has significant for back his hours.   He does not, never did smoke and does not take alcohol.    Family History  Problem Relation Age of Onset  . Diabetes Father     Wt Readings from Last 3 Encounters:  01/18/16 (!) 151.9 kg (334 lb 12.8 oz)  12/01/15 (!) 159 kg (350 lb 9.6 oz)  11/03/15 (!)  156.9 kg (346 lb)    PHYSICAL EXAM BP (!) 150/75   Pulse (!) 57   Ht 6' (1.829 m)   Wt (!) 151.9 kg (334 lb 12.8 oz)   BMI 45.41 kg/m  -- upset driving in w/ traffic.  @ PCP was ~115/70 mmHg General: He is a pleasant, morbidly obese gentleman, he is in no acute distress,But appears to be very anxious and distressed. He is almost exasperated. A&O x3, answers questions appropriately. Well-groomed. HEENT: NCAT, EOMI, MMM. Anicteric sclerae.  Neck: Supple without LAN, JVD or carotid bruit.  Heart: RRR, normal but distant S1, S2. Soft 1-2/6 HSM heard at the apex.. No other rubs or gallops Lungs: CTAB, nonlabored, normal effort, good air movement.  Abdomen: Obese but soft/NT/ND/NABS. Unable to palpate HSM due to obesity.  Extremities: No C/C/E, 2+ and equal pulses throughout.  Neuro: Grossly normal   Adult ECG Report  Rate:  57;  Rhythm: sinus bradycardia, premature ventricular contractions (PVC) and Otherwise normal axis, intervals and durations.;   Narrative Interpretation: Stable EKG, PVCs now present   Other studies Reviewed: Additional studies/ records that were reviewed today include:  Recent Labs:  Followed by PCP (not available)  He reports that he recently had lower extremity arterial Dopplers performed by his PCP that were essentially normal.   ASSESSMENT / PLAN: Problem List Items Addressed This Visit    S/P CABG x 4 (Chronic)    Recent Myoview negative.      Morbid obesity with BMI of 40.0-44.9, adult (HCC) (Chronic)    No longer stress eating. With time from work, he has gotten back into routine exercise. Feeling better. Has a significant weight loss from last visit.  Hopefully once he is able to finally get retirement he will be maintained this level activity and weight loss. He wants to get back into being the "model patient"      Intermittent claudication (HCC)    Per his report, d arterial Dopplers look relatively stable. I suspect this may be microvascular  in nature. He did well with Pletal in the past. We'll restart Pletal 50 mg twice a day.      Relevant Medications   metoprolol tartrate (LOPRESSOR) 25 MG tablet   Hyperlipidemia LDL goal <70 (Chronic)    On high-dose statin. Labs monitored by PCP. Relatively well-controlled.  Should be that are now he has had some weight loss and is back exercising.      Relevant Medications   metoprolol tartrate (LOPRESSOR) 25 MG tablet   Other Relevant Orders   EKG 12-Lead   Essential hypertension (Chronic)    Blood pressure is high today, but he did indicate he was somewhat stressed out getting into the office today and reviewed with traffic. At home his blood pressure recordings have been much better. For now will continue current dose of Diovan and metoprolol, but would consider increasing Diovan to 160, if pressures continue to be elevated.      Relevant Medications   metoprolol tartrate (LOPRESSOR) 25 MG tablet   Other Relevant Orders   EKG 12-Lead   CAD, multiple vessel (Chronic)    Recent Myoview is negative for ischemia. Now that he is handling his stress better being out of work, his chest discomfort episodes have resolved. I would not consider ischemic evaluation at this point unless he would have recurrence of symptoms like he did last visit.  Plan: Continue aspirin, statin and beta blocker/ARB.      Relevant Medications   metoprolol tartrate (LOPRESSOR) 25 MG tablet   Other Relevant Orders   EKG 12-Lead   Anxiety as acute reaction to exceptional stress - Primary    His symptoms have essentially resolved with not working. He is nearing retirement, but would like to try to finish the next month or so of work, at least 2 transition his duties to other employees.  I do think, however that he should only go back to limited duties with administrative and back office duties. He should not be placed in leadership/management positions or any escalation of duties.  I think he probably  should be up and go back to work this coming Monday, October 9 - provider is with limited duties noted above.      Relevant Orders   EKG 12-Lead    Other Visit Diagnoses   None.    30 minutes was spent in direct consultation with the patient. The majority  of this time was spent in direct consultation and discussing his issues of anxiety and stress. Greater than 50% of the time was spent in direct consultation.  Current medicines are reviewed at length with the patient today. (+/- concerns) needs help with anxiety   Medication Instructions:  MAY RETURN TO WORK ON Monday OCT 9 , 2017 - WITH LIMITED DUTIES  START PLETAL (CILOSTAZOL) 50 MG ONE TABLET DAILY   Your physician wants you to follow-up in: 3 MONTHS WITH DR [facility] - 30 MIN   Follow-Up: January 2018 (if feeling better, he can call to delay until April)  Studies Ordered:   Orders Placed This Encounter  Procedures  . EKG 12-Lead      Bryan Lemma, M.D., M.S. Interventional Cardiologist   Pager # [phone] Phone # [phone] [address] [place], [place] [address]

## 2016-01-18 NOTE — Assessment & Plan Note (Signed)
Recent Myoview is negative for ischemia. Now that he is handling his stress better being out of work, his chest discomfort episodes have resolved. I would not consider ischemic evaluation at this point unless he would have recurrence of symptoms like he did last visit.  Plan: Continue aspirin, statin and beta blocker/ARB.

## 2016-01-18 NOTE — Assessment & Plan Note (Signed)
On high-dose statin. Labs monitored by PCP. Relatively well-controlled.  Should be that are now he has had some weight loss and is back exercising.

## 2016-01-18 NOTE — Assessment & Plan Note (Signed)
No longer stress eating. With time from work, he has gotten back into routine exercise. Feeling better. Has a significant weight loss from last visit.  Hopefully once he is able to finally get retirement he will be maintained this level activity and weight loss. He wants to get back into being the "model patient"

## 2016-01-18 NOTE — Patient Instructions (Addendum)
MAY RETURN TO WORK ON Monday OCT 9 , 2017 - WITH LIMITED DUTIES  START PLETAL (CILOSTAZOL) 50 MG ONE TABLET DAILY   Your physician wants you to follow-up in: 3 MONTHS WITH DR HARDING - 30 MIN You will receive a reminder letter in the mail two months in advance. If you don't receive a letter, please call our office to schedule the follow-up appointment.  If you need a refill on your cardiac medications before your next appointment, please call your pharmacy.

## 2016-01-18 NOTE — Assessment & Plan Note (Signed)
Blood pressure is high today, but he did indicate he was somewhat stressed out getting into the office today and reviewed with traffic. At home his blood pressure recordings have been much better. For now will continue current dose of Diovan and metoprolol, but would consider increasing Diovan to 160, if pressures continue to be elevated.

## 2016-01-18 NOTE — Assessment & Plan Note (Signed)
Per his report, d arterial Dopplers look relatively stable. I suspect this may be microvascular in nature. He did well with Pletal in the past. We'll restart Pletal 50 mg twice a day.

## 2016-01-18 NOTE — Assessment & Plan Note (Signed)
Recent Myoview negative.

## 2016-01-18 NOTE — Assessment & Plan Note (Signed)
His symptoms have essentially resolved with not working. He is nearing retirement, but would like to try to finish the next month or so of work, at least 2 transition his duties to other employees.  I do think, however that he should only go back to limited duties with administrative and back office duties. He should not be placed in leadership/management positions or any escalation of duties.  I think he probably should be up and go back to work this coming Monday, October 9 - provider is with limited duties noted above.

## 2016-04-19 DIAGNOSIS — E1165 Type 2 diabetes mellitus with hyperglycemia: Secondary | ICD-10-CM | POA: Diagnosis not present

## 2016-04-26 DIAGNOSIS — E1165 Type 2 diabetes mellitus with hyperglycemia: Secondary | ICD-10-CM | POA: Diagnosis not present

## 2016-04-26 DIAGNOSIS — E782 Mixed hyperlipidemia: Secondary | ICD-10-CM | POA: Diagnosis not present

## 2016-04-26 DIAGNOSIS — I739 Peripheral vascular disease, unspecified: Secondary | ICD-10-CM | POA: Diagnosis not present

## 2016-04-26 DIAGNOSIS — I2510 Atherosclerotic heart disease of native coronary artery without angina pectoris: Secondary | ICD-10-CM | POA: Diagnosis not present

## 2016-06-15 ENCOUNTER — Ambulatory Visit (INDEPENDENT_AMBULATORY_CARE_PROVIDER_SITE_OTHER): Payer: Medicare Other | Admitting: Cardiology

## 2016-06-15 ENCOUNTER — Encounter: Payer: Self-pay | Admitting: Cardiology

## 2016-06-15 VITALS — BP 120/60 | HR 62 | Ht 72.0 in | Wt 304.0 lb

## 2016-06-15 DIAGNOSIS — E785 Hyperlipidemia, unspecified: Secondary | ICD-10-CM | POA: Diagnosis not present

## 2016-06-15 DIAGNOSIS — I2510 Atherosclerotic heart disease of native coronary artery without angina pectoris: Secondary | ICD-10-CM

## 2016-06-15 DIAGNOSIS — I10 Essential (primary) hypertension: Secondary | ICD-10-CM

## 2016-06-15 DIAGNOSIS — Z951 Presence of aortocoronary bypass graft: Secondary | ICD-10-CM

## 2016-06-15 DIAGNOSIS — E119 Type 2 diabetes mellitus without complications: Secondary | ICD-10-CM

## 2016-06-15 DIAGNOSIS — Z6841 Body Mass Index (BMI) 40.0 and over, adult: Secondary | ICD-10-CM

## 2016-06-15 DIAGNOSIS — I739 Peripheral vascular disease, unspecified: Secondary | ICD-10-CM | POA: Diagnosis not present

## 2016-06-15 MED ORDER — CILOSTAZOL 50 MG PO TABS: 50.0000 mg | ORAL_TABLET | Freq: Two times a day (BID) | ORAL | 3 refills | Status: DC

## 2016-06-15 MED ORDER — METOPROLOL SUCCINATE ER 25 MG PO TB24: 25.0000 mg | ORAL_TABLET | Freq: Every day | ORAL | 3 refills | Status: DC

## 2016-06-15 NOTE — Progress Notes (Signed)
PCP: Georgianne FickAMACHANDRAN,AJITH, MD  Clinic Note: Chief Complaint  Patient presents with  . Follow-up    cad-cabg    HPI: Carl LoyalRichard Cullars is a 66 y.o. male with a PMH below who presents today for four-month follow-up for his known CAD-CABG, obesity and recent issues with stress and anxiety.Carl Steele.  Roe Gloor was last seen on 01/18/2016. At that time he had been out of work for just over a month or 2. He was going to the gym and was much more relaxed. Had not noted any more episodes of chest tightness and pressure. The anxiety was all but gone. By a September, the shaking had gone away.  Recent Hospitalizations: None  Studies Reviewed: No new studies  Officially stopped working in [place], although he was out on disability for a month or 2 prior to that.. NOw no longer stressed - lost ~46lb in 6monhts. More exercise & eating less.    Interval History: [name] presents today feeling great with no major cardiac complaints. He has gotten back into his routine exercise with adjusted diet (the diet part is made easier since he has lost his sense of taste). He has not had any more of the stress and anxiety since stopping work. He is officially now retired. He is basically living at home alone but is now able to go down for longer trips to see his [place] in [place]. He is now back at the gym working out very hard and vigorously most days out of the week. He is doing weights elliptical stair stepper. He does other machines as well. He indicates that since he stopped working is not had any further episodes of chest tightness, pressure or dyspnea. He has lost 46 pounds since we first decided to take him out of work. He hopes to lose all the way down to the 280,000 he was initially postoperatively. He is not had any rapid irregular heartbeats or palpitations. No syncope /near-syncope or TIA/amaurosis fugax.  No claudication.  ROS: A comprehensive was performed. Review of Systems  Constitutional: Positive for  weight loss. Negative for malaise/fatigue.  HENT: Negative for nosebleeds.   Gastrointestinal: Negative for blood in stool, constipation and melena.  Musculoskeletal: Negative.   Neurological: Negative for dizziness.  Endo/Heme/Allergies: Does not bruise/bleed easily.  Psychiatric/Behavioral: Negative for depression. The patient is not nervous/anxious (Significantly improved since he stopped working. No longer has the Ativan.).   All other systems reviewed and are negative.   Past Medical History:  Diagnosis Date  . CAD, multiple vessel 06/26/2010   4 atypical chest pressure/dyspnea and frequent PVCs --> no ischemia on Myoview, but significant ectopy --> cardiac cath: 80% proximal LAD, 70% D2, occluded LAD after D2, 70-80% circumflex. Diffuse RCA and PDA lesions.;; Post-CABG Myoview 2012: No ischemia no infarction. EF 55% with reduced TID  . Diabetes mellitus without complication (HCC)   . Hyperlipidemia   . Hypertension   . Morbid obesity with BMI of 40.0-44.9, adult (HCC)   . Postoperative atrial fibrillation (HCC)    No recurrence  . 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 fib without recurrence    Past Surgical History:  Procedure Laterality Date  . CARDIAC CATHETERIZATION  March 2012   Initial Myoview - TID 1.5, frequent PVCs (in pairs & triplets), no ischemia (likely balanced ischemia) --> CATH: 80% PROX LAD, 70% D2, AND AN OCCLUDED LAD AFTER D2 , 70 -80% CIRC WITH SMALL OM2 AND DIFFUSE PROX LESIONS RCA  AND PDA  . CORONARY ARTERY BYPASS GRAFT  07/07/2010   LIMA-LAD,SVG-D2,SVG-DISTAL CIRC, SVG-PDA-POST OP COMPLICATED BY AFIB BUT NO RECURRENCE; endovascular harvest of vein from right thigh; open harvest from left lower leg.  Marland Kitchen DOPPLER ECHOCARDIOGRAPHY  JUNE 2012   MILD DIASTOLIC DYSFUNCTION WITH NORMAL  EF > 55% ,MILD LEFT ATRIAL AND RIGHT ATRIAL DILATION ,MILD TO MODERATRE MITARL REGURG AND AORTIC  VALVE SCLEROSIS  . NM MYOCAR PERF  WALL MOTION  09/22/2010   EF55%, TID was down t0 0.95 from 1.5 and no further arrhythmia   . NM MYOVIEW LTD  11/01/2015   LOW RISK. No ischemia or infarction. EF roughly 50%.    Current Meds  Medication Sig  . aspirin 81 MG tablet Take 81 mg by mouth daily.  Marland Kitchen atorvastatin (LIPITOR) 80 MG tablet Take 80 mg by mouth daily.  . cilostazol (PLETAL) 50 MG tablet Take 1 tablet (50 mg total) by mouth 2 (two) times daily.  . metFORMIN (GLUCOPHAGE-XR) 500 MG 24 hr tablet Take 500 mg by mouth 2 (two) times daily.   . valsartan (DIOVAN) 80 MG tablet Take 80 mg by mouth daily.  . [DISCONTINUED] cilostazol (PLETAL) 50 MG tablet Take 1 tablet (50 mg total) by mouth 2 (two) times daily.  . [DISCONTINUED] metoprolol tartrate (LOPRESSOR) 25 MG tablet Take 25 mg by mouth daily.    No Known Allergies  Social History   Social History  . Marital status: Married    Spouse name: N/A  . Number of children: N/A  . Years of education: N/A   Social History Main Topics  . Smoking status: Never Smoker  . Smokeless tobacco: Never Used  . Alcohol use No  . Drug use: No  . Sexual activity: Not Asked   Other Topics Concern  . None   Social History Narrative   He is a married father of one son who is currently getting close to graduate from college and considering applying to medical school. His wife is actually a real estate agent in [place] and continued to back and forth. He is back working, but has significant for back his hours.   He does not, never did smoke and does not take alcohol.    family history includes Diabetes in his father.  Wt Readings from Last 3 Encounters:  06/15/16 (!) 137.9 kg (304 lb)  01/18/16 (!) 151.9 kg (334 lb 12.8 oz)  12/01/15 (!) 159 kg (350 lb 9.6 oz)    PHYSICAL EXAM BP 120/60   Pulse 62   Ht 6' (1.829 m)   Wt (!) 137.9 kg (304 lb)   BMI 41.23 kg/m  General appearance: alert, cooperative, appears stated age, no distress and Although still morbidly obese,  notably improved from last visit. Well-groomed pleasant mood and affect. Neck: no adenopathy, no carotid bruit and no JVD HEENT: NCAT, EOMI, MMM. Anicteric sclerae.  Neck: Supple without LAN, JVD or carotid bruit.  Heart: RRR, normal but distant S1, S2. Soft 1-2/6 HSM heard at the apex (rare ectopy).. No other rubs or gallops Lungs: CTAB, nonlabored, normal effort, good air movement.  Abdomen: Obese but soft/NT/ND/NABS. Unable to palpate HSM due to obesity.  Extremities: No C/C/E, 2+ and equal pulses throughout.  Neuro: Grossly normal    Adult ECG Report -n/a  Other studies Reviewed: Additional studies/ records that were reviewed today include:  Recent Labs:   - labs from Jan scanned  Na+ 140, K+ 4.3, Cl- 99, HCO3- 22 , BUN 20, Cr 0.97,  Glu 103, Ca2+ 9.4; AST 18, ALT 15, AlkP 98, Alb 4.1, TP 6.9, T Bili 0.5   HgbA1c: 6.4 (down from 7.9 in June 2017)  TC 161, TG 124, HDL 39, LDL 97 (this is the only level not ago)  ASSESSMENT / PLAN: Problem List Items Addressed This Visit    CAD, multiple vessel - Primary (Chronic)    He had multivessel CAD diagnosed by cardiac catheterization performed to evaluate atypical chest tightness and pressure symptoms with frequent PVCs. He had no ischemia on Myoview but significant ectopy. He therefore had a catheterization showing severe proximal multivessel disease. Was referred for CABG, and is now had 2 negative Myoview stress tests in 2012 & most recently in July 2017.  Remains on aspirin, high-dose statin, Lopressor and Diovan. He is also on cilostazol which seemed to help his symptoms in the past. We restarted it when he started having symptoms last year.       Relevant Medications   metoprolol succinate (TOPROL XL) 25 MG 24 hr tablet   Diabetes mellitus without complication (HCC) (Chronic)    Notable improvement in A1c, likely related to his adjusted diet with increased exercise. He is only on metformin at low dose. Managed by PCP.       Essential hypertension (Chronic)    Well-controlled pressure today when he is no longer stressed. Continue beta blocker and ARB.      Relevant Medications   metoprolol succinate (TOPROL XL) 25 MG 24 hr tablet   Hyperlipidemia LDL goal <70 (Chronic)    Not quite at goal with LDL. Everything else seems to be well controlled, hopefully as he continues to lose weight, this will improve. He is on high-dose atorvastatin, but may require additional therapy. Would probably recheck lipids by midyear to see if is any improvement with his weight loss.  - Diet As adjustment. May need to consider Zetia - or even potentially PCSK9 inhibitor.      Relevant Medications   metoprolol succinate (TOPROL XL) 25 MG 24 hr tablet   Intermittent claudication (HCC) (Chronic)    Has had relatively stable Dopplers, but likely microvascular claudication symptoms well controlled with low-dose cilostazol.      Relevant Medications   metoprolol succinate (TOPROL XL) 25 MG 24 hr tablet   Morbid obesity with BMI of 40.0-44.9, adult (HCC) (Chronic)    No longer stress eating. He actuallyit is appetite is gone down as he really has not had a good sense of smell late. Nothing really tastes good anymore. He therefore doesn't eat as much. He fully intends to get back into the "model patient" routine that he was in before -- he has lost 46 pounds since stopping work. This is likely related to him having less stress, and having time to go to the gym and exercise. -- Continue his current course. His nadir weight was down to 280 pounds postoperatively. He wants to get down below that.      S/P CABG x 4 (Chronic)    Negative Myoview in summer of 2017. Would not be due for Aredia follow-up till 2021         Current medicines are reviewed at length with the patient today. (+/- concerns) n/a The following changes have been made: n/a  Patient Instructions  Medication changes: Metoprolol succinate 25mg  once daily.  Your  physician wants you to follow-up in ONE YEAR WITH DR. HARDING. You will receive a reminder letter in the mail two months in advance. If you  don't receive a letter, please call our office to schedule the follow-up appointment.     Studies Ordered:   No orders of the defined types were placed in this encounter.     Bryan Lemma, M.D., M.S. Interventional Cardiologist   Pager # [phone] Phone # [phone] [address] [place], [place] [address]

## 2016-06-15 NOTE — Patient Instructions (Signed)
Medication changes: Metoprolol succinate 25mg  once daily.  Your physician wants you to follow-up in ONE YEAR WITH DR. HARDING. You will receive a reminder letter in the mail two months in advance. If you don't receive a letter, please call our office to schedule the follow-up appointment.

## 2016-06-17 ENCOUNTER — Encounter: Payer: Self-pay | Admitting: Cardiology

## 2016-06-17 NOTE — Assessment & Plan Note (Signed)
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 He had no ischemia on Myoview but significant ectopy. He therefore had a catheterization showing severe proximal multivessel disease. Was referred for CABG, and is now had 2 negative Myoview stress tests in 2012 & most recently in July 2017.  Remains on aspirin, high-dose statin, Lopressor and Diovan. He is also on cilostazol which seemed to help his symptoms in the past. We restarted it when he started having symptoms last year.

## 2016-06-17 NOTE — Assessment & Plan Note (Signed)
Not quite at goal with LDL. Everything else seems to be well controlled, hopefully as he continues to lose weight, this will improve. He is on high-dose atorvastatin, but may require additional therapy. Would probably recheck lipids by midyear to see if is any improvement with his weight loss.  - Diet As adjustment. May need to consider Zetia - or even potentially PCSK9 inhibitor.

## 2016-06-17 NOTE — Assessment & Plan Note (Signed)
Negative Myoview in summer of 2017. Would not be due for Aredia follow-up till 2021

## 2016-06-17 NOTE — Assessment & Plan Note (Signed)
Has had relatively stable Dopplers, but likely microvascular claudication symptoms well controlled with low-dose cilostazol.

## 2016-06-17 NOTE — Assessment & Plan Note (Signed)
No longer stress eating. He actuallyit is appetite is gone down as he really has not had a good sense of smell late. Nothing really tastes good anymore. He therefore doesn't eat as much. He fully intends to get back into the "model patient" routine that he was in before -- he has lost 46 pounds since stopping work. This is likely related to him having less stress, and having time to go to the gym and exercise. -- Continue his current course. His nadir weight was down to 280 pounds postoperatively. He wants to get down below that.

## 2016-06-17 NOTE — Assessment & Plan Note (Signed)
Notable improvement in A1c, likely related to his adjusted diet with increased exercise. He is only on metformin at low dose. Managed by PCP.

## 2016-06-17 NOTE — Assessment & Plan Note (Signed)
Well-controlled pressure today when he is no longer stressed. Continue beta blocker and ARB.

## 2016-10-24 ENCOUNTER — Telehealth: Payer: Self-pay

## 2016-10-24 MED ORDER — CILOSTAZOL 50 MG PO TABS: 50.0000 mg | ORAL_TABLET | Freq: Two times a day (BID) | ORAL | 3 refills | Status: DC

## 2016-10-24 NOTE — Telephone Encounter (Signed)
error 

## 2016-10-25 ENCOUNTER — Other Ambulatory Visit: Payer: Self-pay

## 2016-10-25 ENCOUNTER — Telehealth: Payer: Self-pay | Admitting: Cardiology

## 2016-10-25 MED ORDER — CILOSTAZOL 50 MG PO TABS: 50.0000 mg | ORAL_TABLET | Freq: Two times a day (BID) | ORAL | 3 refills | Status: DC

## 2016-10-25 NOTE — Telephone Encounter (Signed)
°*  STAT* If patient is at the pharmacy, call can be transferred to refill team.   1. Which medications need to be refilled? (please list name of each medication and dose if known) Cilostazol 50 mg  2. Which pharmacy/location (including street and city if local pharmacy) is medication to be sent to? [facility] [phone]  3. Do they need a 30 day or 90 day supply? 90    Patient no longer uses [facility]

## 2016-10-25 NOTE — Telephone Encounter (Signed)
RX ALREADY SENT YESTERDAY

## 2016-11-22 DIAGNOSIS — E782 Mixed hyperlipidemia: Secondary | ICD-10-CM | POA: Diagnosis not present

## 2016-11-22 DIAGNOSIS — I2510 Atherosclerotic heart disease of native coronary artery without angina pectoris: Secondary | ICD-10-CM | POA: Diagnosis not present

## 2016-11-22 DIAGNOSIS — E1165 Type 2 diabetes mellitus with hyperglycemia: Secondary | ICD-10-CM | POA: Diagnosis not present

## 2016-11-22 DIAGNOSIS — Z0000 Encounter for general adult medical examination without abnormal findings: Secondary | ICD-10-CM | POA: Diagnosis not present

## 2016-11-29 DIAGNOSIS — E782 Mixed hyperlipidemia: Secondary | ICD-10-CM | POA: Diagnosis not present

## 2016-11-29 DIAGNOSIS — Z23 Encounter for immunization: Secondary | ICD-10-CM | POA: Diagnosis not present

## 2016-11-29 DIAGNOSIS — E1165 Type 2 diabetes mellitus with hyperglycemia: Secondary | ICD-10-CM | POA: Diagnosis not present

## 2016-11-29 DIAGNOSIS — I2510 Atherosclerotic heart disease of native coronary artery without angina pectoris: Secondary | ICD-10-CM | POA: Diagnosis not present

## 2016-11-29 DIAGNOSIS — I10 Essential (primary) hypertension: Secondary | ICD-10-CM | POA: Diagnosis not present

## 2017-06-05 ENCOUNTER — Other Ambulatory Visit: Payer: Self-pay | Admitting: Cardiology

## 2017-06-06 NOTE — Telephone Encounter (Signed)
Rx has been sent to the pharmacy electronically. ° °

## 2017-07-18 ENCOUNTER — Encounter: Payer: Self-pay | Admitting: Cardiology

## 2017-07-18 ENCOUNTER — Ambulatory Visit (INDEPENDENT_AMBULATORY_CARE_PROVIDER_SITE_OTHER): Payer: Medicare Other | Admitting: Cardiology

## 2017-07-18 VITALS — BP 116/68 | HR 49 | Ht 72.0 in | Wt 298.6 lb

## 2017-07-18 DIAGNOSIS — E1165 Type 2 diabetes mellitus with hyperglycemia: Secondary | ICD-10-CM | POA: Diagnosis not present

## 2017-07-18 DIAGNOSIS — E1169 Type 2 diabetes mellitus with other specified complication: Secondary | ICD-10-CM | POA: Diagnosis not present

## 2017-07-18 DIAGNOSIS — I2510 Atherosclerotic heart disease of native coronary artery without angina pectoris: Secondary | ICD-10-CM

## 2017-07-18 DIAGNOSIS — I10 Essential (primary) hypertension: Secondary | ICD-10-CM | POA: Diagnosis not present

## 2017-07-18 DIAGNOSIS — Z6841 Body Mass Index (BMI) 40.0 and over, adult: Secondary | ICD-10-CM

## 2017-07-18 DIAGNOSIS — E782 Mixed hyperlipidemia: Secondary | ICD-10-CM | POA: Diagnosis not present

## 2017-07-18 DIAGNOSIS — Z951 Presence of aortocoronary bypass graft: Secondary | ICD-10-CM

## 2017-07-18 DIAGNOSIS — E785 Hyperlipidemia, unspecified: Secondary | ICD-10-CM | POA: Diagnosis not present

## 2017-07-18 NOTE — Progress Notes (Signed)
PCP: Georgianne Fick, MD  Clinic Note: Chief Complaint  Patient presents with  . Follow-up    pt denied chest pain and SOB  . Coronary Artery Disease    HPI: Carl Steele is a 67 y.o. male with a PMH below who presents today for annual f/u for CAD-CABG -no active angina, along with morbid obesity and significant stress and anxiety issues.  The stress and anxiety have notably improved since he has retired from work.  Carl Steele was last seen on June 15, 2016.  He was doing quite well with no cardiac complaints.  He was feeling great having stopped working and is doing his routine exercise again.  He was adjusting his diet and work on losing weight.  Still try to get down to 280 pounds, but not quite making it.  Recent Hospitalizations: None  Studies Personally Reviewed - (if available, images/films reviewed: From Epic Chart or Care Everywhere)  Last Myoview was in 2017 -negative for ischemia normal pump function.  Interval History: [name] returns today again in great spirits.  He is doing wonderfully with weight loss.  He is very happy and also that his wife is now back in [place] having moved back up from [place].  The only issue there is that he is now having to cook for her as well as himself and has not been as good about being cautious with eating.  He is also excited that his son is finishing up his third year medical school and is hoping to apply to Internal Medicine Residency in the [place] area.  Really from a cardiac standpoint he has been doing quite well with no active symptoms.  He says he got his weight down, but since his wife has been here is been eating more and has put some weight back on but he still doing his exercise routinely.  He his energy level is great.  He is not had any more recurrent symptoms of angina or palpitations with rest or exertion.  No resting exertional chest tightness pressure dyspnea.  No PND, orthopnea or edema.  No  palpitations, lightheadedness, dizziness, weakness or syncope/near syncope. No TIA/amaurosis fugax symptoms.  No claudication.  ROS: A comprehensive was performed. Review of Systems  Constitutional: Positive for weight loss (Intentional, but not as much as desired).  HENT: Negative for congestion, nosebleeds and sinus pain.   Respiratory: Negative for cough and shortness of breath.   Cardiovascular: Negative for claudication.  Gastrointestinal: Negative for abdominal pain, blood in stool and constipation.  Genitourinary: Negative for hematuria.  Musculoskeletal: Negative for falls and joint pain.  Neurological: Negative for dizziness, focal weakness and weakness.  Psychiatric/Behavioral: Negative for depression. The patient is not nervous/anxious (This is essentially resolved since he retired.).   All other systems reviewed and are negative.   I have reviewed and (if needed) personally updated the patient's problem list, medications, allergies, past medical and surgical history, social and family history.   Past Medical History:  Diagnosis Date  . CAD, multiple vessel 06/26/2010   4 atypical chest pressure/dyspnea and frequent PVCs --> no ischemia on Myoview, but significant ectopy --> cardiac cath: 80% proximal LAD, 70% D2, occluded LAD after D2, 70-80% circumflex. Diffuse RCA and PDA lesions.;; Post-CABG Myoview 2012: No ischemia no infarction. EF 55% with reduced TID  . Diabetes mellitus without complication (HCC)   . Hyperlipidemia   . Hypertension   . Morbid obesity with BMI of 40.0-44.9, adult (HCC)   . Postoperative atrial fibrillation (  HCC)    No recurrence  . 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 fib without recurrence    Past Surgical History:  Procedure Laterality Date  . CARDIAC CATHETERIZATION  March 2012   Initial Myoview - TID 1.5, frequent PVCs (in pairs & triplets), no ischemia (likely balanced ischemia) -->  CATH: 80% PROX LAD, 70% D2, AND AN OCCLUDED LAD AFTER D2 , 70 -80% CIRC WITH SMALL OM2 AND DIFFUSE PROX LESIONS RCA AND PDA  . CORONARY ARTERY BYPASS GRAFT  07/07/2010   LIMA-LAD,SVG-D2,SVG-DISTAL CIRC, SVG-PDA-POST OP COMPLICATED BY AFIB BUT NO RECURRENCE; endovascular harvest of vein from right thigh; open harvest from left lower leg.  Marland Kitchen DOPPLER ECHOCARDIOGRAPHY  JUNE 2012   MILD DIASTOLIC DYSFUNCTION WITH NORMAL  EF > 55% ,MILD LEFT ATRIAL AND RIGHT ATRIAL DILATION ,MILD TO MODERATRE MITARL REGURG AND AORTIC  VALVE SCLEROSIS  . NM MYOCAR PERF WALL MOTION  09/22/2010   EF55%, TID was down t0 0.95 from 1.5 and no further arrhythmia   . NM MYOVIEW LTD  11/01/2015   LOW RISK. No ischemia or infarction. EF roughly 50%.    Current Meds  Medication Sig  . aspirin 81 MG tablet Take 81 mg by mouth daily.  Marland Kitchen atorvastatin (LIPITOR) 80 MG tablet Take 80 mg by mouth daily.  . cilostazol (PLETAL) 50 MG tablet Take 50 mg by mouth daily.  . metFORMIN (GLUCOPHAGE-XR) 500 MG 24 hr tablet Take 500 mg by mouth 2 (two) times daily.   . metoprolol succinate (TOPROL-XL) 25 MG 24 hr tablet TAKE 1 TABLET BY MOUTH  DAILY  . valsartan (DIOVAN) 80 MG tablet Take 80 mg by mouth daily.    No Known Allergies  Social History   Tobacco Use  . Smoking status: Never Smoker  . Smokeless tobacco: Never Used  Substance Use Topics  . Alcohol use: No  . Drug use: No   Social History   Social History Narrative   He is a married father of one son who is currently getting close to graduate from college and considering applying to medical school. His wife is actually a real estate agent in [place] and continued to back and forth. He is back working, but has significant for back his hours.   He does not, never did smoke and does not take alcohol.    family history includes Diabetes in his father.  Wt Readings from Last 3 Encounters:  07/18/17 298 lb 9.6 oz (135.4 kg)  06/15/16 (!) 304 lb (137.9 kg)  01/18/16 (!)  334 lb 12.8 oz (151.9 kg)    PHYSICAL EXAM BP 116/68   Pulse (!) 49   Ht 6' (1.829 m)   Wt 298 lb 9.6 oz (135.4 kg)   BMI 40.50 kg/m  Physical Exam  Constitutional: He is oriented to person, place, and time. He appears well-developed and well-nourished. No distress.  Remains morbidly obese, but has lost weight since last visit.  Well-groomed.  HENT:  Head: Normocephalic and atraumatic.  Eyes: Pupils are equal, round, and reactive to light. Conjunctivae and EOM are normal.  Neck: No hepatojugular reflux and no JVD present. Carotid bruit is not present.  Cardiovascular: Normal rate, regular rhythm, S1 normal, S2 normal and intact distal pulses.  Occasional extrasystoles are present. PMI is displaced (Unable to palpate). Exam reveals no gallop and no friction rub.  Murmur (Soft 1/6 HSM heard at the apex ) heard. Pulmonary/Chest: Effort normal and breath  sounds normal. No respiratory distress. He has no wheezes. He has no rales.  Abdominal: Soft. Bowel sounds are normal. He exhibits no distension. There is no tenderness. There is no rebound.  Obese.  No HSM  Musculoskeletal: Normal range of motion. He exhibits no edema.  Neurological: He is alert and oriented to person, place, and time.  Psychiatric: He has a normal mood and affect. His behavior is normal. Judgment and thought content normal.  In great spirits.  Smiling.  Seems to be very happy  Nursing note and vitals reviewed.   Adult ECG Report  Rate:  49 ;  Rhythm: sinus bradycardia and Cannot exclude anterior MI, age undetermined.  Otherwise normal axis, intervals and durations;   Narrative Interpretation: Stable EKG   Other studies Reviewed: Additional studies/ records that were reviewed today include:  Recent Labs: He apparently has just had labs checked  As of December 02, 2016: TC 145, TG 96, HDL 40, LDL 86.  A1c 6.0.-BUN/creatinine 20/0.95.  TSH 1.29.    ASSESSMENT / PLAN: Problem List Items Addressed This Visit    S/P  CABG x 4 (Chronic)   Relevant Orders   EKG 12-Lead (Completed)   Morbid obesity with BMI of 40.0-44.9, adult (HCC) (Chronic)    He seems to be doing okay in this regard, but he is having issues with dietary discretion his [place] is back [place] in [place].  Hopefully he will continue to abstain from excessive eating and will be able to lose weight with all his exercise.      Hyperlipidemia associated with type 2 diabetes mellitus (HCC) (Chronic)    Not quite at goal with LDL.  He just had labs checked so we will see what they are now with his weight loss, but low threshold for starting Zetia 10 mg daily.      Essential hypertension (Chronic)    Well-controlled today.  I suspect with his diet and exercise with weight loss this will continue to improve.  If his bradycardia is persistent, would probably back down on Toprol to 12.5.      CAD, multiple vessel - Primary (Chronic)    Multivessel CAD noted on cath for atypical chest discomfort/pressure and frequent PVCs.  No longer having either of those symptoms.  He had CABG in 2012 and has had negative Myoview stress tests later on in 2012 and most recently in 2017.  Plan: Continue current dose of beta-blocker as he is not having signs of chronotropic incompetence despite having sinus bradycardia.  Low threshold for reducing it to 12.5. Continue aspirin, statin and ARB. Continue diet and exercise      Relevant Orders   EKG 12-Lead (Completed)       I spent a total of 25minutes with the patient and chart review. >  50% of the time was spent in direct patient consultation.   Current medicines are reviewed at length with the patient today.  (+/- concerns) n/a The following changes have been made:  n/a  Patient Instructions  NO CHANGE WITH CURRENT MEDICATIONS     Your physician wants you to follow-up in 12 MONTHS WITH DR HARDING.You will receive a reminder letter in the mail two months in advance. If you don't receive a letter, please call  our office to schedule the follow-up appointment.    If you need a refill on your cardiac medications before your next appointment, please call your pharmacy.  In 2012 and has had negative Myoview using both   Studies  Ordered:   Orders Placed This Encounter  Procedures  . EKG 12-Lead      Bryan Lemma, M.D., M.S. Interventional Cardiologist   Pager # [phone] Phone # [phone] [address] [place], [place] [address]   Thank you for choosing [facility]!!

## 2017-07-18 NOTE — Patient Instructions (Signed)
NO CHANGE WITH CURRENT MEDICATIONS   Your physician wants you to follow-up in 12 MONTHS WITH DR HARDING. You will receive a reminder letter in the mail two months in advance. If you don't receive a letter, please call our office to schedule the follow-up appointment.     If you need a refill on your cardiac medications before your next appointment, please call your pharmacy.  

## 2017-07-21 ENCOUNTER — Encounter: Payer: Self-pay | Admitting: Cardiology

## 2017-07-21 NOTE — Assessment & Plan Note (Signed)
Well-controlled today.  I suspect with his diet and exercise with weight loss this will continue to improve.  If his bradycardia is persistent, would probably back down on Toprol to 12.5.

## 2017-07-21 NOTE — Assessment & Plan Note (Signed)
Multivessel CAD noted on cath for atypical chest discomfort/pressure and frequent PVCs.  No longer having either of those symptoms.  He had CABG in 2012 and has had negative Myoview stress tests later on in 2012 and most recently in 2017.  Plan: Continue current dose of beta-blocker as he is not having signs of chronotropic incompetence despite having sinus bradycardia.  Low threshold for reducing it to 12.5. Continue aspirin, statin and ARB. Continue diet and exercise

## 2017-07-21 NOTE — Assessment & Plan Note (Signed)
He seems to be doing okay in this regard, but he is having issues with dietary discretion his wife is back up in [place].  Hopefully he will continue to abstain from excessive eating and will be able to lose weight with all his exercise.

## 2017-07-21 NOTE — Assessment & Plan Note (Signed)
Not quite at goal with LDL.  He just had labs checked so we will see what they are now with his weight loss, but low threshold for starting Zetia 10 mg daily.

## 2017-07-25 DIAGNOSIS — I2510 Atherosclerotic heart disease of native coronary artery without angina pectoris: Secondary | ICD-10-CM | POA: Diagnosis not present

## 2017-07-25 DIAGNOSIS — I10 Essential (primary) hypertension: Secondary | ICD-10-CM | POA: Diagnosis not present

## 2017-07-25 DIAGNOSIS — E782 Mixed hyperlipidemia: Secondary | ICD-10-CM | POA: Diagnosis not present

## 2017-07-25 DIAGNOSIS — E1165 Type 2 diabetes mellitus with hyperglycemia: Secondary | ICD-10-CM | POA: Diagnosis not present

## 2017-08-15 ENCOUNTER — Other Ambulatory Visit: Payer: Self-pay | Admitting: Cardiology

## 2017-08-15 NOTE — Telephone Encounter (Signed)
REFILL 

## 2017-10-31 ENCOUNTER — Other Ambulatory Visit: Payer: Self-pay | Admitting: Cardiology

## 2017-10-31 NOTE — Telephone Encounter (Signed)
Rx request sent to pharmacy.  

## 2018-01-16 ENCOUNTER — Other Ambulatory Visit: Payer: Self-pay | Admitting: Cardiology

## 2018-01-23 DIAGNOSIS — Z0000 Encounter for general adult medical examination without abnormal findings: Secondary | ICD-10-CM | POA: Diagnosis not present

## 2018-01-23 DIAGNOSIS — E1165 Type 2 diabetes mellitus with hyperglycemia: Secondary | ICD-10-CM | POA: Diagnosis not present

## 2018-01-23 DIAGNOSIS — I2510 Atherosclerotic heart disease of native coronary artery without angina pectoris: Secondary | ICD-10-CM | POA: Diagnosis not present

## 2018-01-30 DIAGNOSIS — Z0000 Encounter for general adult medical examination without abnormal findings: Secondary | ICD-10-CM | POA: Diagnosis not present

## 2018-01-30 DIAGNOSIS — E1165 Type 2 diabetes mellitus with hyperglycemia: Secondary | ICD-10-CM | POA: Diagnosis not present

## 2018-01-30 DIAGNOSIS — I739 Peripheral vascular disease, unspecified: Secondary | ICD-10-CM | POA: Diagnosis not present

## 2018-01-30 DIAGNOSIS — E782 Mixed hyperlipidemia: Secondary | ICD-10-CM | POA: Diagnosis not present

## 2018-03-27 DIAGNOSIS — H25813 Combined forms of age-related cataract, bilateral: Secondary | ICD-10-CM | POA: Diagnosis not present

## 2018-03-27 DIAGNOSIS — H35371 Puckering of macula, right eye: Secondary | ICD-10-CM | POA: Diagnosis not present

## 2018-03-27 DIAGNOSIS — E119 Type 2 diabetes mellitus without complications: Secondary | ICD-10-CM | POA: Diagnosis not present

## 2018-07-25 ENCOUNTER — Other Ambulatory Visit: Payer: Self-pay | Admitting: Cardiology

## 2018-07-25 NOTE — Telephone Encounter (Signed)
Metoprolol succ refilled. 

## 2018-09-09 ENCOUNTER — Ambulatory Visit: Payer: Medicare Other | Admitting: Cardiology

## 2018-11-07 ENCOUNTER — Other Ambulatory Visit: Payer: Self-pay | Admitting: Cardiology

## 2018-11-20 DIAGNOSIS — I10 Essential (primary) hypertension: Secondary | ICD-10-CM | POA: Diagnosis not present

## 2018-11-20 DIAGNOSIS — I739 Peripheral vascular disease, unspecified: Secondary | ICD-10-CM | POA: Diagnosis not present

## 2018-11-20 DIAGNOSIS — E1165 Type 2 diabetes mellitus with hyperglycemia: Secondary | ICD-10-CM | POA: Diagnosis not present

## 2018-11-20 DIAGNOSIS — I2510 Atherosclerotic heart disease of native coronary artery without angina pectoris: Secondary | ICD-10-CM | POA: Diagnosis not present

## 2018-11-20 DIAGNOSIS — E782 Mixed hyperlipidemia: Secondary | ICD-10-CM | POA: Diagnosis not present

## 2018-11-29 ENCOUNTER — Other Ambulatory Visit: Payer: Self-pay | Admitting: Cardiology

## 2018-12-02 DIAGNOSIS — I739 Peripheral vascular disease, unspecified: Secondary | ICD-10-CM | POA: Diagnosis not present

## 2018-12-02 DIAGNOSIS — I10 Essential (primary) hypertension: Secondary | ICD-10-CM | POA: Diagnosis not present

## 2018-12-02 DIAGNOSIS — E1165 Type 2 diabetes mellitus with hyperglycemia: Secondary | ICD-10-CM | POA: Diagnosis not present

## 2018-12-02 DIAGNOSIS — E782 Mixed hyperlipidemia: Secondary | ICD-10-CM | POA: Diagnosis not present

## 2018-12-02 DIAGNOSIS — I2510 Atherosclerotic heart disease of native coronary artery without angina pectoris: Secondary | ICD-10-CM | POA: Diagnosis not present

## 2018-12-15 ENCOUNTER — Encounter: Payer: Self-pay | Admitting: Cardiology

## 2018-12-15 ENCOUNTER — Other Ambulatory Visit: Payer: Self-pay

## 2018-12-15 ENCOUNTER — Ambulatory Visit (INDEPENDENT_AMBULATORY_CARE_PROVIDER_SITE_OTHER): Payer: Medicare Other | Admitting: Cardiology

## 2018-12-15 VITALS — BP 132/68 | HR 76 | Temp 97.4°F | Ht 72.0 in | Wt 305.0 lb

## 2018-12-15 DIAGNOSIS — E1169 Type 2 diabetes mellitus with other specified complication: Secondary | ICD-10-CM

## 2018-12-15 DIAGNOSIS — I493 Ventricular premature depolarization: Secondary | ICD-10-CM | POA: Insufficient documentation

## 2018-12-15 DIAGNOSIS — Z6841 Body Mass Index (BMI) 40.0 and over, adult: Secondary | ICD-10-CM

## 2018-12-15 DIAGNOSIS — I10 Essential (primary) hypertension: Secondary | ICD-10-CM

## 2018-12-15 DIAGNOSIS — Z951 Presence of aortocoronary bypass graft: Secondary | ICD-10-CM | POA: Diagnosis not present

## 2018-12-15 DIAGNOSIS — I2510 Atherosclerotic heart disease of native coronary artery without angina pectoris: Secondary | ICD-10-CM

## 2018-12-15 DIAGNOSIS — E785 Hyperlipidemia, unspecified: Secondary | ICD-10-CM

## 2018-12-15 MED ORDER — METOPROLOL SUCCINATE ER 50 MG PO TB24: 50.0000 mg | ORAL_TABLET | Freq: Every day | ORAL | 3 refills | Status: DC

## 2018-12-15 NOTE — Progress Notes (Signed)
PCP: Georgianne Fickamachandran, Ajith, MD  Clinic Note: Chief Complaint  Patient presents with  . Follow-up    Doing well.  No complaints  . Coronary Artery Disease    HPI: Carl Steele is a 68 y.o. male with a PMH notable for CAD-CABG & morbid obesity who presents today for delayed annual f/u (delayed 2/2 COVID-19 lockdown).   He was having issues with stress and anxiety -- these have notably improved since he has retired from work.  Carl LoyalRichard Steele was last seen in April 2019 - in great spirits, doing well.   Recent Hospitalizations: None  Studies Personally Reviewed - (if available, images/films reviewed: From Epic Chart or Care Everywhere)  Last Myoview was in 2017 -negative for ischemia normal pump function.  Interval History: Carl Steele returns today again doing well.  A bit frustrated b/c gyms are closed - not getting his workouts in.  Gained weight & lipids a bit up on recent PCP check.  Despite PVCs on EKG -does not note palpitations.  Otherwise is doing well from a CV standpoint: Cardiovascular ROS: no chest pain or dyspnea on exertion positive for - mild swelling negative for - irregular heartbeat, orthopnea, palpitations, paroxysmal nocturnal dyspnea, rapid heart rate, shortness of breath or No syncope/near syncope.TIA/amaurosis fugax symptoms.  No claudication.   He & his wife have purchased a condo down in [place] (to be able to visit their son - now a practicing MD in [place]).  ROS: A comprehensive was performed. Review of Systems  Constitutional: Negative for weight loss (gained wgt back).  HENT: Negative for congestion, nosebleeds and sinus pain.   Respiratory: Negative for cough and shortness of breath.   Cardiovascular: Negative for claudication.  Gastrointestinal: Negative for abdominal pain, blood in stool and constipation.  Genitourinary: Negative for hematuria.  Musculoskeletal: Negative for falls and joint pain.  Neurological: Negative for dizziness, focal  weakness and weakness.  Psychiatric/Behavioral: Negative for depression. The patient is not nervous/anxious (This is essentially resolved since he retired.).   All other systems reviewed and are negative.  The patient does not have symptoms concerning for COVID-19 infection (fever, chills, cough, or new shortness of breath).  The patient is practicing social distancing.   COVID-19 Education: The signs and symptoms of COVID-19 were discussed with the patient and how to seek care for testing (follow up with PCP or arrange E-visit).   The importance of social distancing was discussed today.   I have reviewed and (if needed) personally updated the patient's problem list, medications, allergies, past medical and surgical history, social and family history.   Past Medical History:  Diagnosis Date  . CAD, multiple vessel 06/26/2010   4 atypical chest pressure/dyspnea and frequent PVCs --> no ischemia on Myoview, but significant ectopy --> cardiac cath: 80% proximal LAD, 70% D2, occluded LAD after D2, 70-80% circumflex. Diffuse RCA and PDA lesions.;; Post-CABG Myoview 2012: No ischemia no infarction. EF 55% with reduced TID  . Diabetes mellitus without complication (HCC)   . Hyperlipidemia   . Hypertension   . Morbid obesity with BMI of 40.0-44.9, adult (HCC)   . Postoperative atrial fibrillation (HCC)    No recurrence  . 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 fib without recurrence    Past Surgical History:  Procedure Laterality Date  . CARDIAC CATHETERIZATION  March 2012   Initial Myoview - TID 1.5, frequent PVCs (in pairs & triplets), no ischemia (likely balanced ischemia) --> CATH: 80%  PROX LAD, 70% D2, AND AN OCCLUDED LAD AFTER D2 , 70 -80% CIRC WITH SMALL OM2 AND DIFFUSE PROX LESIONS RCA AND PDA  . CORONARY ARTERY BYPASS GRAFT  07/07/2010   LIMA-LAD,SVG-D2,SVG-DISTAL CIRC, SVG-PDA-POST OP COMPLICATED BY AFIB BUT NO RECURRENCE;  endovascular harvest of vein from right thigh; open harvest from left lower leg.  Marland Kitchen. DOPPLER ECHOCARDIOGRAPHY  JUNE 2012   MILD DIASTOLIC DYSFUNCTION WITH NORMAL  EF > 55% ,MILD LEFT ATRIAL AND RIGHT ATRIAL DILATION ,MILD TO MODERATRE MITARL REGURG AND AORTIC  VALVE SCLEROSIS  . NM MYOCAR PERF WALL MOTION  09/22/2010   EF55%, TID was down t0 0.95 from 1.5 and no further arrhythmia   . NM MYOVIEW LTD  11/01/2015   LOW RISK. No ischemia or infarction. EF roughly 50%.    Current Meds  Medication Sig  . aspirin 81 MG tablet Take 81 mg by mouth daily.  Marland Kitchen. atorvastatin (LIPITOR) 80 MG tablet Take 80 mg by mouth daily.  . metFORMIN (GLUCOPHAGE-XR) 500 MG 24 hr tablet Take 500 mg by mouth 2 (two) times daily.   . valsartan (DIOVAN) 80 MG tablet Take 80 mg by mouth daily.  . [DISCONTINUED] metoprolol succinate (TOPROL-XL) 25 MG 24 hr tablet TAKE 1 TABLET BY MOUTH  DAILY    No Known Allergies  Social History   Tobacco Use  . Smoking status: Never Smoker  . Smokeless tobacco: Never Used  Substance Use Topics  . Alcohol use: No  . Drug use: No   Social History   Social History Narrative   He is a married father of one son who is currently getting close to graduate from college and considering applying to medical school. His wife is actually a real estate agent in [place] and continued to back and forth. He is back working, but has significant for back his hours.   He does not, never did smoke and does not take alcohol.    family history includes Diabetes in his father.  Wt Readings from Last 3 Encounters:  12/15/18 (!) 305 lb (138.3 kg)  07/18/17 298 lb 9.6 oz (135.4 kg)  06/15/16 (!) 304 lb (137.9 kg)    PHYSICAL EXAM BP 132/68 (BP Location: Left Arm, Patient Position: Sitting, Cuff Size: Large)   Pulse 76   Temp (!) 97.4 F (36.3 C)   Ht 6' (1.829 m)   Wt (!) 305 lb (138.3 kg)   BMI 41.37 kg/m  Physical Exam  Constitutional: He is oriented to person, place, and time. He  appears well-developed and well-nourished. No distress.  Remains morbidly obese, but has lost weight since last visit.  Well-groomed.  HENT:  Head: Normocephalic and atraumatic.  Eyes: Pupils are equal, round, and reactive to light. Conjunctivae and EOM are normal.  Neck: No hepatojugular reflux and no JVD present. Carotid bruit is not present.  Cardiovascular: Normal rate, regular rhythm, S1 normal, S2 normal and intact distal pulses.  Occasional extrasystoles are present. PMI is displaced (Unable to palpate). Exam reveals no gallop and no friction rub.  Murmur (Soft 1/6 HSM heard at the apex ) heard. Pulmonary/Chest: Effort normal and breath sounds normal. No respiratory distress. He has no wheezes. He has no rales.  Abdominal: Soft. Bowel sounds are normal. He exhibits no distension. There is no abdominal tenderness. There is no rebound.  Obese.  No HSM  Musculoskeletal: Normal range of motion.        General: No edema.  Neurological: He is alert and oriented to person,  place, and time.  Psychiatric: He has a normal mood and affect. His behavior is normal. Judgment and thought content normal.  In great spirits.  Smiling.  Seems to be very happy  Nursing note and vitals reviewed.   Adult ECG Report  Rate:  76 ;  Rhythm: normal sinus rhythm, premature ventricular contractions (PVC) and fequent PVCs.  otw, normal axis & intervals;   Narrative Interpretation: Stable EKG    Other studies Reviewed: Additional studies/ records that were reviewed today include:  Recent Labs: He apparently has just had labs checked  As of October 2019: TC 132, LDL 72 (he says most recent labs his LDL was 87), HDL 46, TG 72.  A1c 6.0.-BUN/creatinine 20/0.95.  TSH 1.29.   ASSESSMENT / PLAN: Problem List Items Addressed This Visit    S/P CABG x 4 - Primary (Chronic)    We talked about doing screening Myoview in 2021, however since he is now having significant PVCs on his EKG which was 1 of the warning signs  when he first was evaluated, I think we will go ahead and check a Lexiscan Myoview now to exclude any ischemic disease.      Relevant Orders   EKG 12-Lead (Completed)   MYOCARDIAL PERFUSION IMAGING   Morbid obesity with BMI of 40.0-44.9, adult (HCC) (Chronic)   Hyperlipidemia associated with type 2 diabetes mellitus ([place]) (Chronic)    His LDL took a turn for the worse, it probably has to do with being stationary with COVID-19.  I really am encouraging him to try to get back in exercise.  He does not want to try a new medicine to treat this.  He is taking Zetia and atorvastatin and would like to really try to get back on his diet regimen and his exercise hoping to avoid any additional medications.  He is due to have labs checked again in December so I will see him back in January to reevaluate.  He thinks there is 2 reasons that his lipids have gotten worse one being less activity, the other being that his wife is now doing more the cooking and there is more "goodies is in the house "      Frequent unifocal PVCs    This was wanted diagnostic features when he was first diagnosed with multivessel CAD.  He had not really had that many since his CABG on the current dose of Toprol.  Now a little worried because her coming back. Plan: Reevaluate for ischemia with a Myoview stress test and increase Toprol to 50 mg daily.  We will reassess in January timeframe as opposed to waiting a whole year.      Relevant Medications   metoprolol succinate (TOPROL-XL) 50 MG 24 hr tablet   Other Relevant Orders   EKG 12-Lead (Completed)   MYOCARDIAL PERFUSION IMAGING   Essential hypertension (Chronic)    Blood pressure looks pretty good today.  I think he can tolerate increasing the Toprol without having too much of a drop.  Otherwise continue valsartan at current dose.      Relevant Medications   metoprolol succinate (TOPROL-XL) 50 MG 24 hr tablet   CAD, multiple vessel (Chronic)    Significant multivessel  disease on cath now status post CABG.  He really had atypical symptoms but had significant PVCs.  He is not having any chest tightness or pressure or any significant dyspnea besides the fact that he is deconditioned.  However I am very concerned with him having the  significant of PVCs on his EKG.  We had planned on ischemic evaluation next year, but we will go ahead and do a Lexiscan Myoview now as PVCs were 1 of the high signs of his have a multivessel disease.  He was having some issues with chronotropic incompetence so we had backed off on his Toprol.  With him having multiple PVCs on his EKG, will increase Toprol to 50 mg daily. Otherwise continue aspirin statin and ARB. Continue diet and exercise.       Relevant Medications   metoprolol succinate (TOPROL-XL) 50 MG 24 hr tablet   Other Relevant Orders   EKG 12-Lead (Completed)   MYOCARDIAL PERFUSION IMAGING       I spent a total of with the patient and chart review. >  50% of the time was spent in direct patient consultation.   Current medicines are reviewed at length with the patient today.  (+/- concerns) n/a The following changes have been made:  n/a  Patient Instructions  Medication Instructions:   - INCREASE METOPROLOL SUCCINATE 50 MG ONE TABLET DAILY  If you need a refill on your cardiac medications before your next appointment, please call your pharmacy.   Lab work:  NOT NEEDED  Testing/Procedures: WILL BE SCHEDULE AT [address] Your physician has requested that you have a lexiscan myoview. For further information please visit [email or web address] Please follow instruction sheet, as given.   Follow-Up: At [facility], you and your health needs are our priority.  As part of our continuing mission to provide you with exceptional heart care, we have created designated Provider Care Teams.  These Care Teams include your primary Cardiologist (physician) and Advanced Practice  Providers (APPs -  Physician Assistants and Nurse Practitioners) who all work together to provide you with the care you need, when you need it. . You will need a follow up appointment in   5 months JAN 2021.  Please call our office 2 months in advance to schedule this appointment.  You may see Bryan Lemma, MD or one of the following Advanced Practice Providers on your designated Care Team:   . Theodore Demark, PA-C . Joni Reining, DNP, ANP  Any Other Special Instructions Will Be Listed Below (If Applicable). In 2012 and has had negative Myoview using both   Studies Ordered:   Orders Placed This Encounter  Procedures  . MYOCARDIAL PERFUSION IMAGING  . EKG 12-Lead      Bryan Lemma, M.D., M.S. Interventional Cardiologist   Pager # [phone] Phone # [phone] [address] [place], [place] [address]   Thank you for choosing Heartcare at [facility]!!

## 2018-12-15 NOTE — Patient Instructions (Addendum)
Medication Instructions:   - INCREASE METOPROLOL SUCCINATE 50 MG ONE TABLET DAILY  If you need a refill on your cardiac medications before your next appointment, please call your pharmacy.   Lab work:  NOT NEEDED  Testing/Procedures: WILL BE SCHEDULE AT [address] [address] Your physician has requested that you have a lexiscan myoview. For further information please visit [email or web address]. Please follow instruction sheet, as given.   Follow-Up: At [facility], you and your health needs are our priority.  As part of our continuing mission to provide you with exceptional heart care, we have created designated Provider Care Teams.  These Care Teams include your primary Cardiologist (physician) and Advanced Practice Providers (APPs -  Physician Assistants and Nurse Practitioners) who all work together to provide you with the care you need, when you need it. . You will need a follow up appointment in   5 months JAN 2021.  Please call our office 2 months in advance to schedule this appointment.  You may see Glenetta Hew, MD or one of the following Advanced Practice Providers on your designated Care Team:   . Rosaria Ferries, PA-C . Jory Sims, DNP, ANP  Any Other Special Instructions Will Be Listed Below (If Applicable).

## 2018-12-16 ENCOUNTER — Encounter: Payer: Self-pay | Admitting: Cardiology

## 2018-12-16 NOTE — Assessment & Plan Note (Signed)
We talked about doing screening Myoview in 2021, however since he is now having significant PVCs on his EKG which was 1 of the warning signs when he first was evaluated, I think we will go ahead and check a Lexiscan Myoview now to exclude any ischemic disease.

## 2018-12-16 NOTE — Assessment & Plan Note (Signed)
Significant multivessel disease on cath now status post CABG.  He really had atypical symptoms but had significant PVCs.  He is not having any chest tightness or pressure or any significant dyspnea besides the fact that he is deconditioned.  However I am very concerned with him having the significant of PVCs on his EKG.  We had planned on ischemic evaluation next year, but we will go ahead and do a Lexiscan Myoview now as PVCs were 1 of the high signs of his have a multivessel disease.  He was having some issues with chronotropic incompetence so we had backed off on his Toprol.  With him having multiple PVCs on his EKG, will increase Toprol to 50 mg daily. Otherwise continue aspirin statin and ARB. Continue diet and exercise.

## 2018-12-16 NOTE — Assessment & Plan Note (Signed)
Blood pressure looks pretty good today.  I think he can tolerate increasing the Toprol without having too much of a drop.  Otherwise continue valsartan at current dose.

## 2018-12-16 NOTE — Assessment & Plan Note (Signed)
This was wanted diagnostic features when he was first diagnosed with multivessel CAD.  He had not really had that many since his CABG on the current dose of Toprol.  Now a little worried because her coming back. Plan: Reevaluate for ischemia with a Myoview stress test and increase Toprol to 50 mg daily.  We will reassess in January timeframe as opposed to waiting a whole year.

## 2018-12-16 NOTE — Assessment & Plan Note (Signed)
His LDL took a turn for the worse, it probably has to do with being stationary with COVID-19.  I really am encouraging him to try to get back in exercise.  He does not want to try a new medicine to treat this.  He is taking Zetia and atorvastatin and would like to really try to get back on his diet regimen and his exercise hoping to avoid any additional medications.  He is due to have labs checked again in December so I will see him back in January to reevaluate.  He thinks there is 2 reasons that his lipids have gotten worse one being less activity, the other being that his wife is now doing more the cooking and there is more "goodies is in the house "

## 2019-01-06 ENCOUNTER — Telehealth (HOSPITAL_COMMUNITY): Payer: Self-pay

## 2019-01-06 NOTE — Telephone Encounter (Signed)
Encounter complete. 

## 2019-01-07 ENCOUNTER — Telehealth (HOSPITAL_COMMUNITY): Payer: Self-pay

## 2019-01-07 NOTE — Telephone Encounter (Signed)
Encounter complete. 

## 2019-01-08 ENCOUNTER — Ambulatory Visit (HOSPITAL_COMMUNITY)
Admission: RE | Admit: 2019-01-08 | Discharge: 2019-01-08 | Disposition: A | Discharge status: Home / Self Care | Payer: Medicare Other | Source: Ambulatory Visit | Attending: Cardiology | Admitting: Cardiology

## 2019-01-08 ENCOUNTER — Other Ambulatory Visit: Payer: Self-pay

## 2019-01-08 DIAGNOSIS — Z951 Presence of aortocoronary bypass graft: Secondary | ICD-10-CM | POA: Diagnosis not present

## 2019-01-08 DIAGNOSIS — I2510 Atherosclerotic heart disease of native coronary artery without angina pectoris: Secondary | ICD-10-CM | POA: Diagnosis not present

## 2019-01-08 DIAGNOSIS — I493 Ventricular premature depolarization: Secondary | ICD-10-CM

## 2019-01-08 HISTORY — PX: NM MYOVIEW LTD: HXRAD82

## 2019-01-08 MED ORDER — AMINOPHYLLINE 25 MG/ML IV SOLN
75.0000 mg | Freq: Once | INTRAVENOUS | Status: AC
  Administered 2019-01-08: 75 mg via INTRAVENOUS

## 2019-01-08 MED ORDER — TECHNETIUM TC 99M TETROFOSMIN IV KIT
31.5000 | PACK | Freq: Once | INTRAVENOUS | Status: AC | PRN
  Administered 2019-01-08: 31.5 via INTRAVENOUS
  Filled 2019-01-08: qty 32

## 2019-01-08 MED ORDER — REGADENOSON 0.4 MG/5ML IV SOLN
0.4000 mg | Freq: Once | INTRAVENOUS | Status: AC
  Administered 2019-01-08: 0.4 mg via INTRAVENOUS

## 2019-01-09 ENCOUNTER — Ambulatory Visit (HOSPITAL_COMMUNITY)
Admission: RE | Admit: 2019-01-09 | Discharge: 2019-01-09 | Disposition: A | Discharge status: Home / Self Care | Payer: Medicare Other | Source: Ambulatory Visit | Attending: Cardiology | Admitting: Cardiology

## 2019-01-09 LAB — MYOCARDIAL PERFUSION IMAGING
LV dias vol: 189 mL (ref 62–150)
LV sys vol: 96 mL
Peak HR: 83 {beats}/min
Rest HR: 51 {beats}/min
SDS: 6
SRS: 0
SSS: 6
TID: 0.9

## 2019-01-09 MED ORDER — TECHNETIUM TC 99M TETROFOSMIN IV KIT
29.4000 | PACK | Freq: Once | INTRAVENOUS | Status: AC | PRN
  Administered 2019-01-09: 29.4 via INTRAVENOUS

## 2019-01-12 ENCOUNTER — Telehealth: Payer: Self-pay | Admitting: *Deleted

## 2019-01-12 NOTE — Telephone Encounter (Signed)
The patient has been notified of the result and verbalized understanding.  All questions (if any) were answered. Raiford Simmonds, RN 01/12/2019 10:09 AM

## 2019-01-12 NOTE — Telephone Encounter (Signed)
-----   Message from Leonie Man, MD sent at 01/11/2019 10:08 PM EDT ----- Doristine Devoid news.  Stress test looks great.  Pump function still at the low normal range similar to last stress test.  No evidence of prior heart attack or ischemia/areas of concern for blockages.  Looks like all the grafts are open.  Glenetta Hew, MD

## 2019-01-12 NOTE — Telephone Encounter (Signed)
To call back for results of myoview

## 2019-07-24 ENCOUNTER — Telehealth: Payer: Self-pay | Admitting: *Deleted

## 2019-07-24 ENCOUNTER — Telehealth (INDEPENDENT_AMBULATORY_CARE_PROVIDER_SITE_OTHER): Payer: Medicare Other | Admitting: Cardiology

## 2019-07-24 ENCOUNTER — Encounter: Payer: Self-pay | Admitting: Cardiology

## 2019-07-24 VITALS — Ht 72.0 in | Wt 310.0 lb

## 2019-07-24 DIAGNOSIS — Z6841 Body Mass Index (BMI) 40.0 and over, adult: Secondary | ICD-10-CM

## 2019-07-24 DIAGNOSIS — E785 Hyperlipidemia, unspecified: Secondary | ICD-10-CM

## 2019-07-24 DIAGNOSIS — E1169 Type 2 diabetes mellitus with other specified complication: Secondary | ICD-10-CM | POA: Diagnosis not present

## 2019-07-24 DIAGNOSIS — E118 Type 2 diabetes mellitus with unspecified complications: Secondary | ICD-10-CM

## 2019-07-24 DIAGNOSIS — I2510 Atherosclerotic heart disease of native coronary artery without angina pectoris: Secondary | ICD-10-CM

## 2019-07-24 DIAGNOSIS — I10 Essential (primary) hypertension: Secondary | ICD-10-CM

## 2019-07-24 DIAGNOSIS — I493 Ventricular premature depolarization: Secondary | ICD-10-CM

## 2019-07-24 NOTE — Assessment & Plan Note (Signed)
Thankfully, currently asymptomatic.  Tolerating increased dose of Toprol.  Monitor for now.

## 2019-07-24 NOTE — Telephone Encounter (Signed)
1st call to start virtual visit - no answer - left message on answer machine , will call back

## 2019-07-24 NOTE — Patient Instructions (Addendum)
Medication Instructions:   No changes  *If you need a refill on your cardiac medications before your next appointment, please call your pharmacy*   Lab Work:  As you mentioned, you are due to be followed up with PCP, hopefully you get your labs done and we can get the results.     Testing/Procedures:  None   Follow-Up: At [facility], you and your health needs are our priority.  As part of our continuing mission to provide you with exceptional heart care, we have created designated Provider Care Teams.  These Care Teams include your primary Cardiologist (physician) and Advanced Practice Providers (APPs -  Physician Assistants and Nurse Practitioners) who all work together to provide you with the care you need, when you need it.  We recommend signing up for the patient portal called "MyChart".  Sign up information is provided on this After Visit Summary.  MyChart is used to connect with patients for Virtual Visits (Telemedicine).  Patients are able to view lab/test results, encounter notes, upcoming appointments, etc.  Non-urgent messages can be sent to your provider as well.   To learn more about what you can do with MyChart, go to [email or web address].    Your next appointment:   6 month(s)  The format for your next appointment:   In Person  Provider:   Bryan Lemma, MD   Other Instructions Keep exercising -> you are doing great

## 2019-07-24 NOTE — Assessment & Plan Note (Addendum)
Blood pressures have been doing well.  No pressure recorded in today's visit, but he tells me at home they have been ranging in the "normal range which for him we decided was systolic pressures range from 021 to 140 mmHg.  Diastolics less than 90.  Tolerating his Toprol at current dose along with Diovan. -> Would probably increase Diovan next pressures increase.

## 2019-07-24 NOTE — Telephone Encounter (Signed)
VIRTUAL VISIT STARTED

## 2019-07-24 NOTE — Assessment & Plan Note (Signed)
9 years status post CABG for multivessel CAD on cath.  Initial presenting symptoms were actually relatively atypical with exertional fatigue and significant PVCs.  This is associated with intermittent episodes of chest pain or pressure.  He had CABG x4 and is done wonderfully since.  EF improved.  He has had a nonischemic Myoview now in 2017 and 2020.  Most recent echo ordered for increased PVCs noted on EKG.  Again nonischemic with relatively low normal EF.  Is exercising routinely and denying any angina, palpitations or dyspnea.  Noted about 1 month fatigue with increased dose of metoprolol, but now tolerating it well.  Less palpitations.  Plan: We will continue current medications including aspirin, high-dose high intensity statin, increase Toprol and valsartan.

## 2019-07-24 NOTE — Telephone Encounter (Signed)
RN  Left detailed message  to patient , per patient's request.. Instruction were given  from today's virtual visit 07/24/19 .  AVS SUMMARY has been sent by mychart . Recall placed    any question may call back.

## 2019-07-24 NOTE — Assessment & Plan Note (Signed)
He is happy that the gyms have opened back up again starting in late December early January.  He is now working out 4 days a week and also walking at home.  He says he is much happier while he is exercising.  He did gain weight during the COVID-19 lockdown and is having a hard time getting it back off again, but he is definitely making attempts to reduce his caloric intake and get back on his "bandwagon".

## 2019-07-24 NOTE — Assessment & Plan Note (Signed)
Lipids are being followed by PCP.  He is on high-dose atorvastatin.  He is due to see his PCP soon with plans to follow-up labs.  Has had a slight reversal in his lipid trend with the weight gain during COVID-19 lockdown.  We are hoping to see a trend in the right direction now.  If not able to get the LDL down below 70, would strongly consider adding Zetia or Vascepa if close.  However, if not close to 70, may need to refer to our [facility] clinical pharmacist run lipid clinic.

## 2019-07-24 NOTE — Progress Notes (Signed)
Virtual Visit via Telephone Note   This visit type was conducted due to national recommendations for restrictions regarding the COVID-19 Pandemic (e.g. social distancing) in an effort to limit this patient's exposure and mitigate transmission in our community.  Due to his co-morbid illnesses, this patient is at least at moderate risk for complications without adequate follow up.  This format is felt to be most appropriate for this patient at this time.  The patient did not have access to video technology/had technical difficulties with video requiring transitioning to audio format only (telephone).  All issues noted in this document were discussed and addressed.  No physical exam could be performed with this format.  Please refer to the patient's chart for his  consent to telehealth for [facility].   Patient has given verbal permission to conduct this visit via virtual appointment and to bill insurance 07/24/2019 12:45 PM     Evaluation Performed:  Follow-up visit  Date:  07/24/2019   ID:  [name], DOB March 27, 1951, MRN [id number]  Patient Location: Home Provider Location: Home  PCP:  Georgianne Fick, MD  Cardiologist:  Bryan Lemma, MD  Electrophysiologist:  None   Chief Complaint:   Chief Complaint  Patient presents with  . 8 month visit    no complaints , coavid vaccine completed  . Coronary Artery Disease    Myoview Results reviewed via telephone    History of Present Illness:    Carl Steele is a 69 y.o. male with PMH notable for CAD-CABG with mildly reduced EF who presents via audio/video conferencing for a telehealth visit today.  Jedrick Hutcherson was last seen in August 2020 -> doing well.  May be a little bit frustrated with gyms being closed and not doing workouts.  Had gained weight since.  No sensation of palpitations despite PVCs on EKG.  Myoview ordered for surveillance  Toprol increased to 50 mg daily  Hospitalizations:  . n/a  Recent - Interim  CV studies:    The following studies were reviewed today: . Myoview 01/08/2019: EF 45-50% (49%)-mildly reduced global function..  No evidence of ischemia or infarction.  LOW RISK.  Inerval History   Serapio Edelson is in good spirits today.  He is feeling well.  He says he got back into the gym as soon as they started opening up again.  He is now working out 4 days a week indicating that he feels better and happier when he is working out then when he is not.  All the aches and pains of being in his mid to late 60s seem to be exacerbated when he is not exercising and made worse when he is.  His joint pains and muscle aches are better when he is exercising them or not.  He is able to get his heart rate up" work up a good sweat with no evidence of chest pain or pressure or dyspnea. He has not had any issues with palpitations.  He did say that for the first month taking the increased dose of Toprol he was quite fatigued.  He tried to do 1/2 tablet twice daily and did not notice a change, then all of a sudden after about a month he started feeling better and has not had issues since.  Stable cardiac standpoint.  No angina or CHF symptoms.  Cardiovascular ROS: no chest pain or dyspnea on exertion positive for - Mild end of day swelling. negative for - orthopnea, paroxysmal nocturnal dyspnea, rapid heart rate, shortness of  breath or He is not feeling palpitations or irregular heartbeats, syncope/near syncope or TIA/amaurosis fugax.  Claudication  The patient is practicing social distancing.  He completed both COVID-19 vaccine shots March 14 and April 3).  This has given him peace of mind we will get back out and do things more freely.  Positive outlook  ROS:  Please see the history of present illness.    The patient does not have symptoms concerning for COVID-19 infection (fever, chills, cough, or new shortness of breath).  Review of Systems  Constitutional: Negative for malaise/fatigue and weight loss  (Unfortunately, has not been on a drop weight).  HENT: Negative for congestion and nosebleeds.   Gastrointestinal: Positive for abdominal pain. Negative for blood in stool, constipation and melena.  Genitourinary: Negative for dysuria, frequency and hematuria.  Musculoskeletal: Negative for falls, joint pain and myalgias.  Neurological: Negative for dizziness and weakness.  Endo/Heme/Allergies: Negative for environmental allergies.  Psychiatric/Behavioral: Negative for memory loss. The patient is not nervous/anxious (Feels like the weight of the world was removed when he stopped working.) and does not have insomnia.     Past Medical History:  Diagnosis Date  . CAD, multiple vessel 06/26/2010   4 atypical chest pressure/dyspnea and frequent PVCs --> no ischemia on Myoview, but significant ectopy --> cardiac cath: 80% proximal LAD, 70% D2, occluded LAD after D2, 70-80% circumflex. Diffuse RCA and PDA lesions.;; Post-CABG Myoview 2012: No ischemia no infarction. EF 55% with reduced TID  . Diabetes mellitus without complication (HCC)   . Hyperlipidemia   . Hypertension   . Morbid obesity with BMI of 40.0-44.9, adult (HCC)   . Postoperative atrial fibrillation (HCC)    No recurrence  . 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 fib without recurrence   Past Surgical History:  Procedure Laterality Date  . CARDIAC CATHETERIZATION  March 2012   Initial Myoview - TID 1.5, frequent PVCs (in pairs & triplets), no ischemia (likely balanced ischemia) --> CATH: 80% PROX LAD, 70% D2, AND AN OCCLUDED LAD AFTER D2 , 70 -80% CIRC WITH SMALL OM2 AND DIFFUSE PROX LESIONS RCA AND PDA  . CORONARY ARTERY BYPASS GRAFT  07/07/2010   LIMA-LAD,SVG-D2,SVG-DISTAL CIRC, SVG-PDA-POST OP COMPLICATED BY AFIB BUT NO RECURRENCE; endovascular harvest of vein from right thigh; open harvest from left lower leg.  Marland Kitchen DOPPLER ECHOCARDIOGRAPHY  JUNE 2012   MILD DIASTOLIC DYSFUNCTION  WITH NORMAL  EF > 55% ,MILD LEFT ATRIAL AND RIGHT ATRIAL DILATION ,MILD TO MODERATRE MITARL REGURG AND AORTIC  VALVE SCLEROSIS  . NM MYOCAR PERF WALL MOTION  09/22/2010   EF55%, TID was down t0 0.95 from 1.5 and no further arrhythmia   . NM MYOVIEW LTD  11/01/2015   LOW RISK. No ischemia or infarction. EF roughly 50%.     Current Meds  Medication Sig  . aspirin 81 MG tablet Take 81 mg by mouth daily.  Marland Kitchen atorvastatin (LIPITOR) 80 MG tablet Take 80 mg by mouth daily.  . metFORMIN (GLUCOPHAGE-XR) 500 MG 24 hr tablet Take 500 mg by mouth 2 (two) times daily.   . metoprolol succinate (TOPROL-XL) 50 MG 24 hr tablet Take 1 tablet (50 mg total) by mouth daily. Take with or immediately following a meal.  . valsartan (DIOVAN) 80 MG tablet Take 80 mg by mouth daily.     Allergies:   Patient has no known allergies.   Social History   Tobacco Use  . Smoking  status: Never Smoker  . Smokeless tobacco: Never Used  Substance Use Topics  . Alcohol use: No  . Drug use: No     Family Hx: The patient's family history includes Diabetes in his father.   Labs/Other Tests and Data Reviewed:    EKG:  No ECG reviewed.  Recent Labs: No results found for requested labs within last 8760 hours.   Recent Lipid Panel   Wt Readings from Last 3 Encounters:  07/24/19 (!) 310 lb (140.6 kg)  01/08/19 (!) 305 lb (138.3 kg)  12/15/18 (!) 305 lb (138.3 kg)     Objective:    Vital Signs:  Ht 6' (1.829 m)   Wt (!) 310 lb (140.6 kg)   BMI 42.04 kg/m   VITAL SIGNS:  reviewed pleasant male in no acute distress. A&O x 3.  normal Mood & Affect Non-labored respirations  ASSESSMENT & PLAN:    Problem List Items Addressed This Visit    CAD, multiple vessel (Chronic)    9 years status post CABG for multivessel CAD on cath.  Initial presenting symptoms were actually relatively atypical with exertional fatigue and significant PVCs.  This is associated with intermittent episodes of chest pain or  pressure.  He had CABG x4 and is done wonderfully since.  EF improved.  He has had a nonischemic Myoview now in 2017 and 2020.  Most recent echo ordered for increased PVCs noted on EKG.  Again nonischemic with relatively low normal EF.  Is exercising routinely and denying any angina, palpitations or dyspnea.  Noted about 1 month fatigue with increased dose of metoprolol, but now tolerating it well.  Less palpitations.  Plan: We will continue current medications including aspirin, high-dose high intensity statin, increase Toprol and valsartan.        Morbid obesity with BMI of 40.0-44.9, adult ([place]) (Chronic)    He is happy that the gyms have opened back up again starting in late December early January.  He is now working out 4 days a week and also walking at home.  He says he is much happier while he is exercising.  He did gain weight during the COVID-19 lockdown and is having a hard time getting it back off again, but he is definitely making attempts to reduce his caloric intake and get back on his "bandwagon".      Type II diabetes mellitus with complication (HCC) (Chronic)    Currently only on Metformin.  Monitor closely by PCP. Appropriately on Diovan (ARB).  For additional treatment, would consider PCSK9 inhibitor or GLP 2 agonist given their tendency for cardiovascular benefit.      Hyperlipidemia associated with type 2 diabetes mellitus ([place]) (Chronic)    Lipids are being followed by PCP.  He is on high-dose atorvastatin.  He is due to see his PCP soon with plans to follow-up labs.  Has had a slight reversal in his lipid trend with the weight gain during COVID-19 lockdown.  We are hoping to see a trend in the right direction now.  If not able to get the LDL down below 70, would strongly consider adding Zetia or Vascepa if close.  However, if not close to 70, may need to refer to our [facility] clinical pharmacist run lipid clinic.      Essential hypertension (Chronic)    Blood  pressures have been doing well.  No pressure recorded in today's visit, but he tells me at home they have been ranging in the "normal range which for  him we decided was systolic pressures range from 742 to 140 mmHg.  Diastolics less than 90.  Tolerating his Toprol at current dose along with Diovan. -> Would probably increase Diovan next pressures increase.      Frequent unifocal PVCs    Thankfully, currently asymptomatic.  Tolerating increased dose of Toprol.  Monitor for now.         COVID-19 Education: The signs and symptoms of COVID-19 were discussed with the patient and how to seek care for testing (follow up with PCP or arrange E-visit).   The importance of social distancing was discussed today.  Time:   Today, I have spent 15 minutes with the patient with telehealth technology discussing the above problems.   5 minutes spent charting   Medication Adjustments/Labs and Tests Ordered: Current medicines are reviewed at length with the patient today.  Concerns regarding medicines are outlined above.   Patient Instructions  Medication Instructions:   No changes  *If you need a refill on your cardiac medications before your next appointment, please call your pharmacy*   Lab Work:  As you mentioned, you are due to be followed up with PCP, hopefully you get your labs done and we can get the results.     Testing/Procedures:  None   Follow-Up: At [facility], you and your health needs are our priority.  As part of our continuing mission to provide you with exceptional heart care, we have created designated Provider Care Teams.  These Care Teams include your primary Cardiologist (physician) and Advanced Practice Providers (APPs -  Physician Assistants and Nurse Practitioners) who all work together to provide you with the care you need, when you need it.  We recommend signing up for the patient portal called "MyChart".  Sign up information is provided on this After Visit  Summary.  MyChart is used to connect with patients for Virtual Visits (Telemedicine).  Patients are able to view lab/test results, encounter notes, upcoming appointments, etc.  Non-urgent messages can be sent to your provider as well.   To learn more about what you can do with MyChart, go to [email or web address].    Your next appointment:   6 month(s)  The format for your next appointment:   In Person  Provider:   Bryan Lemma, MD   Other Instructions Keep exercising -> you are doing great     Signed, Bryan Lemma, MD  07/24/2019 12:45 PM    [facility]

## 2019-07-24 NOTE — Assessment & Plan Note (Signed)
Currently only on Metformin.  Monitor closely by PCP. Appropriately on Diovan (ARB).  For additional treatment, would consider PCSK9 inhibitor or GLP 2 agonist given their tendency for cardiovascular benefit.

## 2019-09-15 DIAGNOSIS — I10 Essential (primary) hypertension: Secondary | ICD-10-CM | POA: Diagnosis not present

## 2019-09-15 DIAGNOSIS — K429 Umbilical hernia without obstruction or gangrene: Secondary | ICD-10-CM | POA: Diagnosis not present

## 2019-09-15 DIAGNOSIS — Z7984 Long term (current) use of oral hypoglycemic drugs: Secondary | ICD-10-CM | POA: Diagnosis not present

## 2019-09-15 DIAGNOSIS — K449 Diaphragmatic hernia without obstruction or gangrene: Secondary | ICD-10-CM | POA: Diagnosis not present

## 2019-09-15 DIAGNOSIS — Z79899 Other long term (current) drug therapy: Secondary | ICD-10-CM | POA: Diagnosis not present

## 2019-09-15 DIAGNOSIS — E1169 Type 2 diabetes mellitus with other specified complication: Secondary | ICD-10-CM | POA: Diagnosis not present

## 2019-09-15 DIAGNOSIS — R1032 Left lower quadrant pain: Secondary | ICD-10-CM | POA: Diagnosis not present

## 2019-09-15 DIAGNOSIS — K5790 Diverticulosis of intestine, part unspecified, without perforation or abscess without bleeding: Secondary | ICD-10-CM | POA: Diagnosis not present

## 2019-09-15 DIAGNOSIS — N132 Hydronephrosis with renal and ureteral calculous obstruction: Secondary | ICD-10-CM | POA: Diagnosis not present

## 2019-09-15 DIAGNOSIS — N201 Calculus of ureter: Secondary | ICD-10-CM | POA: Diagnosis not present

## 2019-09-15 DIAGNOSIS — E785 Hyperlipidemia, unspecified: Secondary | ICD-10-CM | POA: Diagnosis not present

## 2019-10-20 DIAGNOSIS — I739 Peripheral vascular disease, unspecified: Secondary | ICD-10-CM | POA: Diagnosis not present

## 2019-10-20 DIAGNOSIS — E782 Mixed hyperlipidemia: Secondary | ICD-10-CM | POA: Diagnosis not present

## 2019-10-20 DIAGNOSIS — I2510 Atherosclerotic heart disease of native coronary artery without angina pectoris: Secondary | ICD-10-CM | POA: Diagnosis not present

## 2019-10-20 DIAGNOSIS — E1165 Type 2 diabetes mellitus with hyperglycemia: Secondary | ICD-10-CM | POA: Diagnosis not present

## 2019-10-20 DIAGNOSIS — I10 Essential (primary) hypertension: Secondary | ICD-10-CM | POA: Diagnosis not present

## 2019-10-20 DIAGNOSIS — Z0000 Encounter for general adult medical examination without abnormal findings: Secondary | ICD-10-CM | POA: Diagnosis not present

## 2019-10-27 DIAGNOSIS — I739 Peripheral vascular disease, unspecified: Secondary | ICD-10-CM | POA: Diagnosis not present

## 2019-10-27 DIAGNOSIS — E1165 Type 2 diabetes mellitus with hyperglycemia: Secondary | ICD-10-CM | POA: Diagnosis not present

## 2019-10-27 DIAGNOSIS — E782 Mixed hyperlipidemia: Secondary | ICD-10-CM | POA: Diagnosis not present

## 2019-10-27 DIAGNOSIS — Z0000 Encounter for general adult medical examination without abnormal findings: Secondary | ICD-10-CM | POA: Diagnosis not present

## 2019-12-09 ENCOUNTER — Telehealth: Payer: Self-pay | Admitting: Cardiology

## 2019-12-09 NOTE — Telephone Encounter (Signed)
LVM FOR PATIENT TO RETURN CALL TO GET FOLLOW UP SCHEDULED WITH HARDING FROM RECALL LIST 

## 2020-01-15 DIAGNOSIS — I4821 Permanent atrial fibrillation: Secondary | ICD-10-CM

## 2020-01-15 DIAGNOSIS — I493 Ventricular premature depolarization: Secondary | ICD-10-CM

## 2020-01-15 HISTORY — DX: Ventricular premature depolarization: I49.3

## 2020-01-15 HISTORY — DX: Permanent atrial fibrillation: I48.21

## 2020-02-05 ENCOUNTER — Other Ambulatory Visit: Payer: Self-pay | Admitting: Cardiology

## 2020-02-05 ENCOUNTER — Telehealth: Payer: Self-pay | Admitting: Cardiology

## 2020-02-05 MED ORDER — METOPROLOL SUCCINATE ER 50 MG PO TB24: 50.0000 mg | ORAL_TABLET | Freq: Every day | ORAL | 3 refills | Status: DC

## 2020-02-05 NOTE — Telephone Encounter (Signed)
*  STAT* If patient is at the pharmacy, call can be transferred to refill team.   1. Which medications need to be refilled?  metoprolol succinate (TOPROL-XL) 50 MG 24 hr tablet 2. Which pharmacy/location (including street and city if local pharmacy) is medication to be sent to? [id number] - [place], [place] - [address]  3. Do they need a 30 day or 90 day supply? Patient is requesting a 6 day emergency supply. He is completely out of medication and he would like to have a 6 day supply sent to local pharmacy to hold him over until mail order with [facility] arrives.

## 2020-02-05 NOTE — Telephone Encounter (Signed)
°*  STAT* If patient is at the pharmacy, call can be transferred to refill team.   1. Which medications need to be refilled? (please list name of each medication and dose if known)   metoprolol succinate (TOPROL-XL) 50 MG 24 hr tablet     2. Which pharmacy/location (including street and city if local pharmacy) is medication to be sent to? [facility] - [place], [place] - [address], [address]  3. Do they need a 30 day or 90 day supply? 90 day supply  Patient is completely out of medication.

## 2020-02-11 ENCOUNTER — Telehealth: Payer: Self-pay | Admitting: *Deleted

## 2020-02-11 ENCOUNTER — Other Ambulatory Visit: Payer: Self-pay

## 2020-02-11 ENCOUNTER — Ambulatory Visit (INDEPENDENT_AMBULATORY_CARE_PROVIDER_SITE_OTHER): Payer: Medicare Other | Admitting: Cardiology

## 2020-02-11 ENCOUNTER — Encounter: Payer: Self-pay | Admitting: Cardiology

## 2020-02-11 VITALS — BP 122/68 | HR 97 | Ht 72.0 in | Wt 312.6 lb

## 2020-02-11 DIAGNOSIS — I4891 Unspecified atrial fibrillation: Secondary | ICD-10-CM | POA: Diagnosis not present

## 2020-02-11 DIAGNOSIS — Z951 Presence of aortocoronary bypass graft: Secondary | ICD-10-CM

## 2020-02-11 DIAGNOSIS — Z6841 Body Mass Index (BMI) 40.0 and over, adult: Secondary | ICD-10-CM

## 2020-02-11 DIAGNOSIS — I10 Essential (primary) hypertension: Secondary | ICD-10-CM

## 2020-02-11 DIAGNOSIS — I4821 Permanent atrial fibrillation: Secondary | ICD-10-CM | POA: Insufficient documentation

## 2020-02-11 DIAGNOSIS — E785 Hyperlipidemia, unspecified: Secondary | ICD-10-CM

## 2020-02-11 DIAGNOSIS — E1169 Type 2 diabetes mellitus with other specified complication: Secondary | ICD-10-CM | POA: Diagnosis not present

## 2020-02-11 DIAGNOSIS — I493 Ventricular premature depolarization: Secondary | ICD-10-CM

## 2020-02-11 DIAGNOSIS — I739 Peripheral vascular disease, unspecified: Secondary | ICD-10-CM

## 2020-02-11 DIAGNOSIS — I2510 Atherosclerotic heart disease of native coronary artery without angina pectoris: Secondary | ICD-10-CM

## 2020-02-11 MED ORDER — APIXABAN 2.5 MG PO TABS: 2.5000 mg | ORAL_TABLET | Freq: Two times a day (BID) | ORAL | 3 refills | Status: DC

## 2020-02-11 MED ORDER — AMIODARONE HCL 200 MG PO TABS: ORAL_TABLET | ORAL | 0 refills | Status: DC

## 2020-02-11 NOTE — Assessment & Plan Note (Signed)
Very unfortunate that this happens to coincide with when he was off his metoprolol from 5 days.  I suspect that this is something that was just brewing and now came to head.  Plan: Start Eliquis 5 mg twice daily for maintenance, and stop aspirin  Check 2D echo  30-day monitor (Zio patch) start in 2 weeks  After 4 weeks of being on Eliquis -> amiodarone 400 mg twice daily x1 week and then to 200 mg twice daily x1 week.  This will hopefully potentially chemically cardiovert.  Follow-up in January.

## 2020-02-11 NOTE — Patient Instructions (Addendum)
Medication Instructions:   stop taking aspirin    Start taking Eliquis 5 mg one tablet twice a day , now   4 weeks from now ( Mar 10, 2020 )  Start taking Amiodarone for 2 weeks - the first week take 400 mg  ( 2 tablets)  Twice a day for 7 days , then  Take 200 mg ( one tablet ) twice a day then stop .    *If you need a refill on your cardiac medications before your next appointment, please call your pharmacy*   Lab Work: Not needed If you have labs (blood work) drawn today and your tests are completely normal, you will receive your results only by: Marland Kitchen MyChart Message (if you have MyChart) OR . A paper copy in the mail If you have any lab test that is abnormal or we need to change your treatment, we will call you to review the results.   Testing/Procedures:  [address]-  Week of Nov 1 , 2020 Your physician has requested that you have an echocardiogram. Echocardiography is a painless test that uses sound waves to create images of your heart. It provides your doctor with information about the size and shape of your heart and how well your heart's chambers and valves are working. This procedure takes approximately one hour. There are no restrictions for this procedure. And  in 2 weeks  Around Nov 11,2021 it will be mailed you - Your physician has recommended that you wear an event monitor 30 days. Event monitors are medical devices that record the heart's electrical activity. Doctors most often [place] these monitors to diagnose arrhythmias. Arrhythmias are problems with the speed or rhythm of the heartbeat. The monitor is a small, portable device. You can wear one while you do your normal daily activities. This is usually used to diagnose what is causing palpitations/syncope (passing out).       Follow-Up: At [facility], you and your health needs are our priority.  As part of our continuing mission to provide you with exceptional heart care, we have created  designated Provider Care Teams.  These Care Teams include your primary Cardiologist (physician) and Advanced Practice Providers (APPs -  Physician Assistants and Nurse Practitioners) who all work together to provide you with the care you need, when you need it.    Your next appointment:   3 month(s)  The format for your next appointment:   In Person  Provider:   Bryan Lemma, MD   Other Instructions  Atrial Fibrillation  Atrial fibrillation is a type of heartbeat that is irregular or fast. If you have this condition, your heart beats without any order. This makes it hard for your heart to pump blood in a normal way. Atrial fibrillation may come and go, or it may become a long-lasting problem. If this condition is not treated, it can put you at higher risk for stroke, heart failure, and other heart problems. What are the causes? This condition may be caused by diseases that damage the heart. They include:  High blood pressure.  Heart failure.  Heart valve disease.  Heart surgery. Other causes include:  Diabetes.  Thyroid disease.  Being overweight.  Kidney disease. Sometimes the cause is not known. What increases the risk? You are more likely to develop this condition if:  You are older.  You smoke.  You exercise often and very hard.  You have a family history of this condition.  You are  a man.  You use drugs.  You drink a lot of alcohol.  You have lung conditions, such as emphysema, pneumonia, or COPD.  You have sleep apnea. What are the signs or symptoms? Common symptoms of this condition include:  A feeling that your heart is beating very fast.  Chest pain or discomfort.  Feeling short of breath.  Suddenly feeling light-headed or weak.  Getting tired easily during activity.  Fainting.  Sweating. In some cases, there are no symptoms. How is this treated? Treatment for this condition depends on underlying conditions and how you feel when  you have atrial fibrillation. They include:  Medicines to: ? Prevent blood clots. ? Treat heart rate or heart rhythm problems.  Using devices, such as a pacemaker, to correct heart rhythm problems.  Doing surgery to remove the part of the heart that sends bad signals.  Closing an area where clots can form in the heart (left atrial appendage). In some cases, your doctor will treat other underlying conditions. Follow these instructions at home: Medicines  Take over-the-counter and prescription medicines only as told by your doctor.  Do not take any new medicines without first talking to your doctor.  If you are taking blood thinners: ? Talk with your doctor before you take any medicines that have aspirin or NSAIDs, such as ibuprofen, in them. ? Take your medicine exactly as told by your doctor. Take it at the same time each day. ? Avoid activities that could hurt or bruise you. Follow instructions about how to prevent falls. ? Wear a bracelet that says you are taking blood thinners. Or, carry a card that lists what medicines you take. Lifestyle      Do not use any products that have nicotine or tobacco in them. These include cigarettes, e-cigarettes, and chewing tobacco. If you need help quitting, ask your doctor.  Eat heart-healthy foods. Talk with your doctor about the right eating plan for you.  Exercise regularly as told by your doctor.  Do not drink alcohol.  Lose weight if you are overweight.  Do not use drugs, including cannabis. General instructions  If you have a condition that causes breathing to stop for a short period of time (apnea), treat it as told by your doctor.  Keep a healthy weight. Do not use diet pills unless your doctor says they are safe for you. Diet pills may make heart problems worse.  Keep all follow-up visits as told by your doctor. This is important. Contact a doctor if:  You notice a change in the speed, rhythm, or strength of your  heartbeat.  You are taking a blood-thinning medicine and you get more bruising.  You get tired more easily when you move or exercise.  You have a sudden change in weight. Get help right away if:   You have pain in your chest or your belly (abdomen).  You have trouble breathing.  You have side effects of blood thinners, such as blood in your vomit, poop (stool), or pee (urine), or bleeding that cannot stop.  You have any signs of a stroke. "BE FAST" is an easy way to remember the main warning signs: ? B - Balance. Signs are dizziness, sudden trouble walking, or loss of balance. ? E - Eyes. Signs are trouble seeing or a change in how you see. ? F - Face. Signs are sudden weakness or loss of feeling in the face, or the face or eyelid drooping on one side. ? A - Arms.  Signs are weakness or loss of feeling in an arm. This happens suddenly and usually on one side of the body. ? S - Speech. Signs are sudden trouble speaking, slurred speech, or trouble understanding what people say. ? T - Time. Time to call emergency services. Write down what time symptoms started.  You have other signs of a stroke, such as: ? A sudden, very bad headache with no known cause. ? Feeling like you may vomit (nausea). ? Vomiting. ? A seizure. These symptoms may be an emergency. Do not wait to see if the symptoms will go away. Get medical help right away. Call your local emergency services (911 in the [place] Do not drive yourself to the hospital. Summary  Atrial fibrillation is a type of heartbeat that is irregular or fast.  You are at higher risk of this condition if you smoke, are older, have diabetes, or are overweight.  Follow your doctor's instructions about medicines, diet, exercise, and follow-up visits.  Get help right away if you have signs or symptoms of a stroke.  Get help right away if you cannot catch your breath, or you have chest pain or discomfort. This information is not intended to  replace advice given to you by your health care provider. Make sure you discuss any questions you have with your health care provider. Document Revised: 09/24/2018 Document Reviewed: 09/24/2018 Elsevier Patient Education  2020 [employer].  [employer] Cardiac Event Monitor Instructions _ days, (1-30). [employer] may call or text to confirm a shipping address. The monitor will be sent to a land address via UPS. [employer] will not ship a monitor to a PO BOX. It typically takes 3-5 days to receive your monitor after it has been enrolled. [employer] will assist with USPS tracking if your package is delayed. The telephone number for [employer] is [phone]. Once you have received your monitor, please review the enclosed instrucYour physician has requested you wear your cardiac event monitor for ___30_tions. Instruction tutorials can also be viewed under help and settings on the enclosed cell phone. Your monitor has already been registered assigning a specific monitor serial # to you.  Applying the monitor Remove cell phone from case and turn it on. The cell phone works as IT consultant and needs to be within [employer] of you at all times. The cell phone will need to be charged on a daily basis. We recommend you plug the cell phone into the enclosed charger at your bedside table every night.  Monitor batteries: You will receive two monitor batteries labelled #1 and #2. These are your recorders. Plug battery #2 onto the second connection on the enclosed charger. Keep one battery on the charger at all times. This will keep the monitor battery deactivated. It will also keep it fully charged for when you need to switch your monitor batteries. A small light will be blinking on the battery emblem when it is charging. The light on the battery emblem will remain on when the battery is fully charged.  Open package of a Monitor strip. Insert battery #1 into black hood on strip and gently squeeze  monitor battery onto connection as indicated in instruction booklet. Set aside while preparing skin.  Choose location for your strip, vertical or horizontal, as indicated in the instruction booklet. Shave to remove all hair from location. There cannot be any lotions, oils, powders, or colognes on skin where monitor is to be applied. Wipe skin clean with enclosed Saline wipe. Dry skin completely.  Peel  paper labeled #1 off the back of the Monitor strip exposing the adhesive. Place the monitor on the chest in the vertical or horizontal position shown in the instruction booklet. One arrow on the monitor strip must be pointing upward. Carefully remove paper labeled #2, attaching remainder of strip to your skin. Try not to create any folds or wrinkles in the strip as you apply it.  Firmly press and release the circle in the center of the monitor battery. You will hear a small beep. This is turning the monitor battery on. The heart emblem on the monitor battery will light up every 5 seconds if the monitor battery in turned on and connected to the patient securely. Do not push and hold the circle down as this turns the monitor battery off. The cell phone will locate the monitor battery. A screen will appear on the cell phone checking the connection of your monitor strip. This may read poor connection initially but change to good connection within the next minute. Once your monitor accepts the connection you will hear a series of 3 beeps followed by a climbing crescendo of beeps. A screen will appear on the cell phone showing the two monitor strip placement options. Touch the picture that demonstrates where you applied the monitor strip.  Your monitor strip and battery are waterproof. You are able to shower, bathe, or swim with the monitor on. They just ask you do not submerge deeper than 3 feet underwater. We recommend removing the monitor if you are swimming in a lake, river, or ocean.  Your  monitor battery will need to be switched to a fully charged monitor battery approximately once a week. The cell phone will alert you of an action which needs to be made.  On the cell phone, tap for details to reveal connection status, monitor battery status, and cell phone battery status. The green dots indicates your monitor is in good status. A red dot indicates there is something that needs your attention.  To record a symptom, click the circle on the monitor battery. In 30-60 seconds a list of symptoms will appear on the cell phone. Select your symptom and tap save. Your monitor will record a sustained or significant arrhythmia regardless of you clicking the button. Some patients do not feel the heart rhythm irregularities. [employer] will notify us of any serious or critical events.  Refer to instruction booklet for instructions on switching batteries, changing strips, the Do not disturb or Pause features, or any additional questions.  Call [employer] at [phone], to confirm your monitor is transmitting and record your baseline. They will answer any questions you may have regarding the monitor instructions at that time.  Returning the monitor to [employer] Place all equipment back into blue box. Peel off strip of paper to expose adhesive and close box securely. There is a prepaid UPS shipping label on this box. Drop in a UPS drop box, or at a UPS facility like [employer]. You may also contact [employer] to arrange UPS to pick up monitor package at your home.

## 2020-02-11 NOTE — Telephone Encounter (Signed)
Left detailed message  Instruction to be at [address]  to pick up 30 day event monitor . Patient is aware he will start monitoring on Feb 25, 2020.  any question may call back

## 2020-02-11 NOTE — Progress Notes (Signed)
Primary Care Provider: Georgianne Fick, MD Cardiologist: Bryan Lemma, MD Electrophysiologist: None  Clinic Note: Chief Complaint  Patient presents with  . Follow-up    Feels fine.  No major complaints  . Coronary Artery Disease    No angina    HPI:    Carl Steele is a 69 y.o. male with a PMH notable for CAD-CABG, FREQUENT PVCS and mildly reduced EF who presents today for 58-month-in person follow-up.  Problem List Items Addressed This Visit    CAD, multiple vessel (Chronic)    9-1/2 years out from his CABG for multivessel CAD.  Remains relatively asymptomatic from a cardiac standpoint as far as any anginal symptoms with rest or exertion.  He has however having a lot of his PVCs again which was one of the harbingers. Was evaluated last year with a Myoview stress test showing no evidence of ischemia.  He is on Toprol (unfortunately it was not filled for him for 6 days), along with aspirin and atorvastatin  -> We are stopping aspirin since he is started on Eliquis.      Relevant Medications   apixaban (ELIQUIS) 2.5 MG TABS tablet   amiodarone (PACERONE) 200 MG tablet (Start on 02/25/2020)   Other Relevant Orders   EKG 12-Lead (Completed)   ECHOCARDIOGRAM COMPLETE   S/P CABG x 4 (Chronic)   Morbid obesity with BMI of 40.0-44.9, adult (HCC) (Chronic)    He is still doing his routine exercise.  Summaries and he has been having a really hard time maintaining any further weight loss after he got out of his current weight.  I am hoping that going on for the county weather we have better and he will get more exercise ball down there.  I have sent motion to make sure that he eats healthy.      Hyperlipidemia associated with type 2 diabetes mellitus (HCC) (Chronic)    Unfortunately, LDL is not yet at target.  There was a big pill for her to swallow the diagnosis of A. fib.  I did choose to go into the lipid management concerns as well.  When he comes back from [place] we  can reassess and discuss whether not we need to add Zetia or Nexletol)      Essential hypertension (Chronic)   Relevant Medications   apixaban (ELIQUIS) 2.5 MG TABS tablet   amiodarone (PACERONE) 200 MG tablet (Start on 02/25/2020)   Intermittent claudication (HCC) (Chronic)    May or may not be true claudication.  He is on cilostazol which has almost been more stabilizing his palpitations than anything else.  Okay to restart cilostazol      Frequent unifocal PVCs (Chronic)    Still has frequent PVCs.  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  Unfortunately now he has A. fib.  We are therefore checking an echocardiogram to make sure has been no changes to his EF as well as atrial sizes.  Plan: Continue Toprol with low threshold to titrate up to  once we see how he does with short course of amiodarone in an attempt to cardiovert      Relevant Medications   apixaban (ELIQUIS) 2.5 MG TABS tablet   amiodarone (PACERONE) 200 MG tablet (Start on 02/25/2020)   Other Relevant Orders   EKG 12-Lead (Completed)   CARDIAC EVENT MONITOR   New onset atrial fibrillation (HCC) - Primary    Very unfortunate that this happens to coincide with when he was off his metoprolol from 5 days.  I suspect  that this is something that was just brewing and now came to head.  Plan: Start Eliquis 5 mg twice daily for maintenance, and stop aspirin  Check 2D echo  30-day monitor (Zio patch) start in 2 weeks  After 4 weeks of being on Eliquis -> amiodarone 400 mg twice daily x1 week and then to 200 mg twice daily x1 week.  This will hopefully potentially chemically cardiovert.  Follow-up in January.      Relevant Medications   apixaban (ELIQUIS) 2.5 MG TABS tablet   amiodarone (PACERONE) 200 MG tablet (Start on 02/25/2020)   Other Relevant Orders   EKG 12-Lead (Completed)   ECHOCARDIOGRAM COMPLETE   CARDIAC EVENT MONITOR     Carl Steele was last seen on July 24, 2019 via telemedicine for follow-up of  surveillance Myoview stress test ordered because of PVCs.  I have also increased his Toprol to 50 mg daily. -->  He was working out 4 days a week and feeling much better and happier than he had been in quite some time.  Feels better working out, not.  Does have aches and pains but not bad.  Actually better when he is exercising.  He says that he is able to get his heart rate up enough to "work up a good sweat "without having any chest tightness or pressure. ->  It did take him a little while to titrate up to the full dose of Toprol, 5 he was able to do so.  Recent Hospitalizations:   September 15, 2019: ER visit for ureterolithiasis.  Reviewed  CV studies:    The following studies were reviewed today: (if available, images/films reviewed: From Epic Chart or Care Everywhere) . Last Myoview 01/08/2019: EF~50%.  LOW RISK.  No ischemia or infarction.  Borderline reduced EF.  Interval History:   Carl Steele returns here today for 27-month follow-up appointment really prior to moving down to [place] for the winter time.  He and his wife have purchased a condo down outside of [place], but have yet 10 out of go down to stay for any prolonged period of time.  The intention is for them to be able to go down spent time the other son who is now a medicine resident. Gerlene Burdock has been very active and exercises routinely.  He is doing about the same he is doing back in March.  He says his weights been up and down.  He feels as though he is "net" losing weight, but has plateaued a little bit.  He denies any exertional dyspnea or chest tightness/pressure.  No PND orthopnea or edema.  Although we saw PVCs on his EKG, and he has A. fib on his EKG today, he does not feel anything unusual.  No TIA or amaurosis fugax symptoms.  No symptoms of fatigue or exercise intolerance.  No irregular heartbeats or palpitations nothing to suggest fast or irregular heart rhythms.  No claudication.  The patient does not have symptoms  concerning for COVID-19 infection (fever, chills, cough, or new shortness of breath).   REVIEWED OF SYSTEMS   Review of Systems  Constitutional: Negative for malaise/fatigue and weight loss.  HENT: Negative for congestion.   Respiratory: Negative for shortness of breath.   Gastrointestinal: Negative for abdominal pain, blood in stool and melena.  Genitourinary: Negative for hematuria.       Normal urinary issues.  No  Musculoskeletal: Positive for joint pain (Just normal aches and pains). Negative for falls.  Neurological: Negative for dizziness,  focal weakness and headaches.  Psychiatric/Behavioral: Negative for depression and memory loss. The patient is not nervous/anxious and does not have insomnia.    I have reviewed and (if needed) personally updated the patient's problem list, medications, allergies, past medical and surgical history, social and family history.   PAST MEDICAL HISTORY   Past Medical History:  Diagnosis Date  . CAD, multiple vessel 06/26/2010   4 atypical chest pressure/dyspnea and frequent PVCs --> no ischemia on Myoview, but significant ectopy --> cardiac cath: 80% proximal LAD, 70% D2, occluded LAD after D2, 70-80% circumflex. Diffuse RCA and PDA lesions.;; Post-CABG Myoview 2012: No ischemia no infarction. EF 55% with reduced TID  . Diabetes mellitus without complication (HCC)   . Hyperlipidemia   . Hypertension   . Morbid obesity with BMI of 40.0-44.9, adult (HCC)   . Postoperative atrial fibrillation (HCC)    No recurrence  . 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 fib without recurrence    PAST SURGICAL HISTORY   Past Surgical History:  Procedure Laterality Date  . CARDIAC CATHETERIZATION  March 2012   Initial Myoview - TID 1.5, frequent PVCs (in pairs & triplets), no ischemia (likely balanced ischemia) --> CATH: 80% PROX LAD, 70% D2, AND AN OCCLUDED LAD AFTER D2 , 70 -80% CIRC WITH SMALL OM2 AND  DIFFUSE PROX LESIONS RCA AND PDA  . CORONARY ARTERY BYPASS GRAFT  07/07/2010   LIMA-LAD,SVG-D2,SVG-DISTAL CIRC, SVG-PDA-POST OP COMPLICATED BY AFIB BUT NO RECURRENCE; endovascular harvest of vein from right thigh; open harvest from left lower leg.  Marland Kitchen DOPPLER ECHOCARDIOGRAPHY  JUNE 2012   MILD DIASTOLIC DYSFUNCTION WITH NORMAL  EF > 55% ,MILD LEFT ATRIAL AND RIGHT ATRIAL DILATION ,MILD TO MODERATRE MITARL REGURG AND AORTIC  VALVE SCLEROSIS  . NM MYOCAR PERF WALL MOTION  09/22/2010   EF55%, TID was down t0 0.95 from 1.5 and no further arrhythmia   . NM MYOVIEW LTD  01/08/2019   LOW RISK. No ischemia or infarction. EF roughly 50%.    Immunization History  Administered Date(s) Administered  . PFIZER SARS-COV-2 Vaccination 06/28/2019, 07/18/2019    MEDICATIONS/ALLERGIES   Current Meds  Medication Sig  . atorvastatin (LIPITOR) 80 MG tablet Take 80 mg by mouth daily.  . metFORMIN (GLUCOPHAGE-XR) 500 MG 24 hr tablet Take 500 mg by mouth 2 (two) times daily.   . metoprolol succinate (TOPROL-XL) 50 MG 24 hr tablet Take 1 tablet (50 mg total) by mouth daily. Take with or immediately following a meal.  . valsartan (DIOVAN) 80 MG tablet Take 80 mg by mouth daily.  . [DISCONTINUED] aspirin 81 MG tablet Take 81 mg by mouth daily.    No Known Allergies  SOCIAL HISTORY/FAMILY HISTORY   Reviewed in Epic:  Pertinent findings:   He is planning on going down to his condo down in [place] leaving on November 9 with plans to maybe come back briefly in January only to go back through March or so.  OBJCTIVE -PE, EKG, labs   Wt Readings from Last 3 Encounters:  02/11/20 (!) 312 lb 9.6 oz (141.8 kg)  07/24/19 (!) 310 lb (140.6 kg)  01/08/19 (!) 305 lb (138.3 kg)    Physical Exam: BP 122/68   Pulse 97   Ht 6' (1.829 m)   Wt (!) 312 lb 9.6 oz (141.8 kg)   BMI 42.40 kg/m  Physical Exam Vitals reviewed.  Constitutional:      General: He is not  in acute distress.    Appearance: He is obese. He is  not ill-appearing or toxic-appearing.     Comments: Remains morbidly obese with BMI of 42.  Well-groomed.  HENT:     Head: Normocephalic and atraumatic.  Neck:     Vascular: No carotid bruit, hepatojugular reflux or JVD.  Cardiovascular:     Rate and Rhythm: Normal rate. Rhythm irregularly irregular.     Pulses: Intact distal pulses. No decreased pulses (Difficult to palpate pedal pulses due to body habitus, but present).     Heart sounds: S1 normal and S2 normal. Heart sounds are distant. Murmur (Soft 1-2/6 HSM at apex.) heard.  No friction rub. No gallop.   Pulmonary:     Effort: Pulmonary effort is normal. No respiratory distress.     Breath sounds: Normal breath sounds.     Comments: Somewhat distant breath sounds but clear Chest:     Chest wall: No tenderness.  Abdominal:     General: Bowel sounds are normal. There is no distension.     Palpations: Abdomen is soft. There is no mass.     Tenderness: There is no abdominal tenderness.     Comments: Obese.  Unable to assess HSM  Musculoskeletal:     Cervical back: Normal range of motion and neck supple.  Neurological:     Mental Status: He is alert.     Adult ECG Report  Rate: 97;  Rhythm: atrial fibrillation, premature ventricular contractions (PVC) and Otherwise normal axis, intervals and durations.;   Narrative Interpretation: New diagnosis of A. fib.  PVCs are not noted.  Recent Labs: 10/20/2019 -> TC 143, TG 94, HDL 40, LDL 82; Cr 1.1, K+ 4.3.  A1c 7.0.  No results found for: TSH  ASSESSMENT/PLAN   Steve is here for more stable from a symptomatic standpoint, however he is now in A. fib along with his PVCs.  Thankfully, we just did a Myoview which was nonischemic.  He is essentially asymptomatic with no heart failure or angina symptoms and no sensation of being in A. fib.  I think we can probably allow him to stay in A. fib, but would like to see his A. fib burden.  New onset of A. Fib - This patients CHA2DS2-VASc Score  and unadjusted Ischemic Stroke Rate (% per year) is equal to 3.2 % stroke rate/year from a score of 3  Above score calculated as 1 point each if present [CHF, HTN, DM, Vascular=MI/PAD/Aortic Plaque, Age if 65-74, or Male] Above score calculated as 2 points each if present [Age > 75, or Stroke/TIA/TE]  Plan:  We will check an echocardiogram just to exclude any new wall motion changes plan to get baseline assessment of his EF and atrial sizes.  Start Eliquis 5 twice daily  We will order 30-day event monitor which she will start wearing in 2 weeks.  In 4 weeks (after 2 weeks of wearing monitor) he will initiate amiodarone loaded with 400 mg twice daily for 1 week followed by 200 mg twice daily for 1 week. => This will be attempted chemically cardiovert him once we know he has had a month of anticoagulation.  Make sure that he has adequate prescriptions with Toprol.  Low threshold to increase dose once to see what happens after amiodarone.  Stop aspirin once he starts Eliquis  Restart cilostazol (in the past, for some reason this has been effective in keeping his palpitations abated.)  Continue current dose of ARB, and statin ->  with new onset A. fib, I chose not to address the fact that his LDL is not at goal.  This can be addressed later. We will follow up with me when he comes back in January.  ----------------------------------------  COVID-19 Education: The signs and symptoms of COVID-19 were discussed with the patient and how to seek care for testing (follow up with PCP or arrange E-visit).   The importance of social distancing and COVID-19 vaccination was discussed today. The patient is practicing social distancing & Masking.   I spent a total of with the patient spent in direct patient consultation.  Additional time spent with chart review  / charting (studies, outside notes, etc): 16 Total Time:   Current medicines are reviewed at length with the patient  today.  (+/- concerns)  -needs to make sure he has prescriptions filled enough for him to be covered while he is in [place].  This visit occurred during the SARS-CoV-2 public health emergency.  Safety protocols were in place, including screening questions prior to the visit, additional usage of staff PPE, and extensive cleaning of exam room while observing appropriate contact time as indicated for disinfecting solutions.  Notice: This dictation was prepared with Dragon dictation along with smaller phrase technology. Any transcriptional errors that result from this process are unintentional and may not be corrected upon review.  Patient Instructions / Medication Changes & Studies & Tests Ordered   Patient Instructions  Medication Instructions:   stop taking aspirin    Start taking Eliquis 5 mg one tablet twice a day , now   4 weeks from now ( Mar 10, 2020 )  Start taking Amiodarone for 2 weeks - the first week take 400 mg  ( 2 tablets)  Twice a day for 7 days , then  Take 200 mg ( one tablet ) twice a day then stop .    *If you need a refill on your cardiac medications before your next appointment, please call your pharmacy*   Lab Work: Not needed If you have labs (blood work) drawn today and your tests are completely normal, you will receive your results only by: Marland Kitchen MyChart Message (if you have MyChart) OR . A paper copy in the mail If you have any lab test that is abnormal or we need to change your treatment, we will call you to review the results.   Testing/Procedures:  [address]-  Week of Nov 1 , 2020 Your physician has requested that you have an echocardiogram. Echocardiography is a painless test that uses sound waves to create images of your heart. It provides your doctor with information about the size and shape of your heart and how well your heart's chambers and valves are working. This procedure takes approximately one hour. There are no restrictions  for this procedure. And  in 2 weeks  Around Nov 11,2021 it will be mailed you - Your physician has recommended that you wear an event monitor 30 days. Event monitors are medical devices that record the heart's electrical activity. Doctors most often [place] these monitors to diagnose arrhythmias. Arrhythmias are problems with the speed or rhythm of the heartbeat. The monitor is a small, portable device. You can wear one while you do your normal daily activities. This is usually used to diagnose what is causing palpitations/syncope (passing out).       Follow-Up: At [facility], you and your health needs are our priority.  As part of our continuing  mission to provide you with exceptional heart care, we have created designated Provider Care Teams.  These Care Teams include your primary Cardiologist (physician) and Advanced Practice Providers (APPs -  Physician Assistants and Nurse Practitioners) who all work together to provide you with the care you need, when you need it.    Your next appointment:   3 month(s)  The format for your next appointment:   In Person  Provider:   Bryan Lemma, MD   Other Instructions  Atrial Fibrillation  Atrial fibrillation is a type of heartbeat that is irregular or fast. If you have this condition, your heart beats without any order. This makes it hard for your heart to pump blood in a normal way. Atrial fibrillation may come and go, or it may become a long-lasting problem. If this condition is not treated, it can put you at higher risk for stroke, heart failure, and other heart problems. What are the causes? This condition may be caused by diseases that damage the heart. They include:  High blood pressure.  Heart failure.  Heart valve disease.  Heart surgery. Other causes include:  Diabetes.  Thyroid disease.  Being overweight.  Kidney disease. Sometimes the cause is not known. What increases the risk? You are more likely to develop this  condition if:  You are older.  You smoke.  You exercise often and very hard.  You have a family history of this condition.  You are a man.  You use drugs.  You drink a lot of alcohol.  You have lung conditions, such as emphysema, pneumonia, or COPD.  You have sleep apnea. What are the signs or symptoms? Common symptoms of this condition include:  A feeling that your heart is beating very fast.  Chest pain or discomfort.  Feeling short of breath.  Suddenly feeling light-headed or weak.  Getting tired easily during activity.  Fainting.  Sweating. In some cases, there are no symptoms. How is this treated? Treatment for this condition depends on underlying conditions and how you feel when you have atrial fibrillation. They include:  Medicines to: ? Prevent blood clots. ? Treat heart rate or heart rhythm problems.  Using devices, such as a pacemaker, to correct heart rhythm problems.  Doing surgery to remove the part of the heart that sends bad signals.  Closing an area where clots can form in the heart (left atrial appendage). In some cases, your doctor will treat other underlying conditions. Follow these instructions at home: Medicines  Take over-the-counter and prescription medicines only as told by your doctor.  Do not take any new medicines without first talking to your doctor.  If you are taking blood thinners: ? Talk with your doctor before you take any medicines that have aspirin or NSAIDs, such as ibuprofen, in them. ? Take your medicine exactly as told by your doctor. Take it at the same time each day. ? Avoid activities that could hurt or bruise you. Follow instructions about how to prevent falls. ? Wear a bracelet that says you are taking blood thinners. Or, carry a card that lists what medicines you take. Lifestyle      Do not use any products that have nicotine or tobacco in them. These include cigarettes, e-cigarettes, and chewing tobacco.  If you need help quitting, ask your doctor.  Eat heart-healthy foods. Talk with your doctor about the right eating plan for you.  Exercise regularly as told by your doctor.  Do not drink alcohol.  Lose weight  if you are overweight.  Do not use drugs, including cannabis. General instructions  If you have a condition that causes breathing to stop for a short period of time (apnea), treat it as told by your doctor.  Keep a healthy weight. Do not use diet pills unless your doctor says they are safe for you. Diet pills may make heart problems worse.  Keep all follow-up visits as told by your doctor. This is important. Contact a doctor if:  You notice a change in the speed, rhythm, or strength of your heartbeat.  You are taking a blood-thinning medicine and you get more bruising.  You get tired more easily when you move or exercise.  You have a sudden change in weight. Get help right away if:   You have pain in your chest or your belly (abdomen).  You have trouble breathing.  You have side effects of blood thinners, such as blood in your vomit, poop (stool), or pee (urine), or bleeding that cannot stop.  You have any signs of a stroke. "BE FAST" is an easy way to remember the main warning signs: ? B - Balance. Signs are dizziness, sudden trouble walking, or loss of balance. ? E - Eyes. Signs are trouble seeing or a change in how you see. ? F - Face. Signs are sudden weakness or loss of feeling in the face, or the face or eyelid drooping on one side. ? A - Arms. Signs are weakness or loss of feeling in an arm. This happens suddenly and usually on one side of the body. ? S - Speech. Signs are sudden trouble speaking, slurred speech, or trouble understanding what people say. ? T - Time. Time to call emergency services. Write down what time symptoms started.  You have other signs of a stroke, such as: ? A sudden, very bad headache with no known cause. ? Feeling like you may vomit  (nausea). ? Vomiting. ? A seizure. These symptoms may be an emergency. Do not wait to see if the symptoms will go away. Get medical help right away. Call your local emergency services (911 in the [place] Do not drive yourself to the hospital. Summary  Atrial fibrillation is a type of heartbeat that is irregular or fast.  You are at higher risk of this condition if you smoke, are older, have diabetes, or are overweight.  Follow your doctor's instructions about medicines, diet, exercise, and follow-up visits.  Get help right away if you have signs or symptoms of a stroke.  Get help right away if you cannot catch your breath, or you have chest pain or discomfort. This information is not intended to replace advice given to you by your health care provider. Make sure you discuss any questions you have with your health care provider. Document Revised: 09/24/2018 Document Reviewed: 09/24/2018 Elsevier Patient Education  2020 [employer]  [employer] Cardiac Event Monitor Instructions _ days, (1-30). [employer] may call or text to confirm a shipping address. The monitor will be sent to a land address via UPS. [employer] will not ship a monitor to a PO BOX. It typically takes 3-5 days to receive your monitor after it has been enrolled. [employer] will assist with USPS tracking if your package is delayed. The telephone number for [employer] is [phone]. Once you have received your monitor, please review the enclosed instrucYour physician has requested you wear your cardiac event monitor for ___30_tions. Instruction tutorials can also be viewed under help and settings on the enclosed cell  phone. Your monitor has already been registered assigning a specific monitor serial # to you.  Applying the monitor Remove cell phone from case and turn it on. The cell phone works as IT consultant and needs to be within [employer] of you at all times. The cell phone will need to be charged on a daily  basis. We recommend you plug the cell phone into the enclosed charger at your bedside table every night.  Monitor batteries: You will receive two monitor batteries labelled #1 and #2. These are your recorders. Plug battery #2 onto the second connection on the enclosed charger. Keep one battery on the charger at all times. This will keep the monitor battery deactivated. It will also keep it fully charged for when you need to switch your monitor batteries. A small light will be blinking on the battery emblem when it is charging. The light on the battery emblem will remain on when the battery is fully charged.  Open package of a Monitor strip. Insert battery #1 into black hood on strip and gently squeeze monitor battery onto connection as indicated in instruction booklet. Set aside while preparing skin.  Choose location for your strip, vertical or horizontal, as indicated in the instruction booklet. Shave to remove all hair from location. There cannot be any lotions, oils, powders, or colognes on skin where monitor is to be applied. Wipe skin clean with enclosed Saline wipe. Dry skin completely.  Peel paper labeled #1 off the back of the Monitor strip exposing the adhesive. Place the monitor on the chest in the vertical or horizontal position shown in the instruction booklet. One arrow on the monitor strip must be pointing upward. Carefully remove paper labeled #2, attaching remainder of strip to your skin. Try not to create any folds or wrinkles in the strip as you apply it.  Firmly press and release the circle in the center of the monitor battery. You will hear a small beep. This is turning the monitor battery on. The heart emblem on the monitor battery will light up every 5 seconds if the monitor battery in turned on and connected to the patient securely. Do not push and hold the circle down as this turns the monitor battery off. The cell phone will locate the monitor battery. A screen will  appear on the cell phone checking the connection of your monitor strip. This may read poor connection initially but change to good connection within the next minute. Once your monitor accepts the connection you will hear a series of 3 beeps followed by a climbing crescendo of beeps. A screen will appear on the cell phone showing the two monitor strip placement options. Touch the picture that demonstrates where you applied the monitor strip.  Your monitor strip and battery are waterproof. You are able to shower, bathe, or swim with the monitor on. They just ask you do not submerge deeper than 3 feet underwater. We recommend removing the monitor if you are swimming in a lake, river, or ocean.  Your monitor battery will need to be switched to a fully charged monitor battery approximately once a week. The cell phone will alert you of an action which needs to be made.  On the cell phone, tap for details to reveal connection status, monitor battery status, and cell phone battery status. The green dots indicates your monitor is in good status. A red dot indicates there is something that needs your attention.  To record a symptom, click the circle on  the monitor battery. In 30-60 seconds a list of symptoms will appear on the cell phone. Select your symptom and tap save. Your monitor will record a sustained or significant arrhythmia regardless of you clicking the button. Some patients do not feel the heart rhythm irregularities. [employer] will notify us of any serious or critical events.  Refer to instruction booklet for instructions on switching batteries, changing strips, the Do not disturb or Pause features, or any additional questions.  Call [employer] at [phone], to confirm your monitor is transmitting and record your baseline. They will answer any questions you may have regarding the monitor instructions at that time.  Returning the monitor to [employer] Place all equipment back  into blue box. Peel off strip of paper to expose adhesive and close box securely. There is a prepaid UPS shipping label on this box. Drop in a UPS drop box, or at a UPS facility like [employer]. You may also contact [employer] to arrange UPS to pick up monitor package at your home.      Studies Ordered:   Orders Placed This Encounter  Procedures  . CARDIAC EVENT MONITOR  . EKG 12-Lead  . ECHOCARDIOGRAM COMPLETE     Bryan Lemma, M.D., M.S. Interventional Cardiologist   Pager # [phone] Phone # [phone] [address] [place], [place] [address]   Thank you for choosing [facility] at [facility]!!

## 2020-02-14 ENCOUNTER — Encounter: Payer: Self-pay | Admitting: Cardiology

## 2020-02-14 NOTE — Assessment & Plan Note (Signed)
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  Remains relatively asymptomatic from a cardiac standpoint as far as any anginal symptoms with rest or exertion.  He has however having a lot of his PVCs again which was one of the harbingers. Was evaluated last year with a Myoview stress test showing no evidence of ischemia.  He is on Toprol (unfortunately it was not filled for him for 6 days), along with aspirin and atorvastatin  -> We are stopping aspirin since he is started on Eliquis.

## 2020-02-14 NOTE — Assessment & Plan Note (Signed)
Unfortunately, LDL is not yet at target.  There was a big pill for her to swallow the diagnosis of A. fib.  I did choose to go into the lipid management concerns as well.  When he comes back from [place] we can reassess and discuss whether not we need to add Zetia or Nexletol)

## 2020-02-14 NOTE — Assessment & Plan Note (Signed)
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  Unfortunately now he has A. fib.  We are therefore checking an echocardiogram to make sure has been no changes to his EF as well as atrial sizes.  Plan: Continue Toprol with low threshold to titrate up to 100mg  once we see how he does with short course of amiodarone in an attempt to cardiovert

## 2020-02-14 NOTE — Assessment & Plan Note (Signed)
He is still doing his routine exercise.  Summaries and he has been having a really hard time maintaining any further weight loss after he got out of his current weight.  I am hoping that going [place] better and he will get more exercise ball down there.  I have sent motion to make sure that he eats healthy.

## 2020-02-14 NOTE — Assessment & Plan Note (Signed)
May or may not be true claudication.  He is on cilostazol which has almost been more stabilizing his palpitations than anything else.  Okay to restart cilostazol

## 2020-02-15 ENCOUNTER — Ambulatory Visit (HOSPITAL_COMMUNITY)
Admission: RE | Admit: 2020-02-15 | Discharge: 2020-02-15 | Disposition: A | Discharge status: Home / Self Care | Payer: Medicare Other | Source: Ambulatory Visit | Attending: Cardiology | Admitting: Cardiology

## 2020-02-15 ENCOUNTER — Other Ambulatory Visit: Payer: Self-pay

## 2020-02-15 DIAGNOSIS — I119 Hypertensive heart disease without heart failure: Secondary | ICD-10-CM | POA: Diagnosis not present

## 2020-02-15 DIAGNOSIS — I2510 Atherosclerotic heart disease of native coronary artery without angina pectoris: Secondary | ICD-10-CM | POA: Insufficient documentation

## 2020-02-15 DIAGNOSIS — I4891 Unspecified atrial fibrillation: Secondary | ICD-10-CM | POA: Diagnosis not present

## 2020-02-15 DIAGNOSIS — E119 Type 2 diabetes mellitus without complications: Secondary | ICD-10-CM | POA: Diagnosis not present

## 2020-02-15 DIAGNOSIS — E785 Hyperlipidemia, unspecified: Secondary | ICD-10-CM | POA: Diagnosis not present

## 2020-02-15 LAB — ECHOCARDIOGRAM COMPLETE
AR max vel: 1.86 cm2
AV Area VTI: 1.84 cm2
AV Area mean vel: 1.76 cm2
AV Mean grad: 3.5 mmHg
AV Peak grad: 6.4 mmHg
Ao pk vel: 1.27 m/s
Area-P 1/2: 5.48 cm2
Calc EF: 40.1 %
S' Lateral: 3.3 cm
Single Plane A2C EF: 31.1 %
Single Plane A4C EF: 46 %

## 2020-02-15 MED ORDER — PERFLUTREN LIPID MICROSPHERE
1.0000 mL | INTRAVENOUS | Status: AC | PRN
  Administered 2020-02-15: 2 mL via INTRAVENOUS
  Filled 2020-02-15: qty 10

## 2020-02-15 NOTE — Progress Notes (Signed)
°  Echocardiogram 2D Echocardiogram with definity has been performed.  Leta Jungling M 02/15/2020, 3:00 PM

## 2020-02-17 ENCOUNTER — Telehealth: Payer: Self-pay | Admitting: Cardiology

## 2020-02-17 DIAGNOSIS — I4891 Unspecified atrial fibrillation: Secondary | ICD-10-CM

## 2020-02-17 NOTE — Telephone Encounter (Signed)
Directions confirmed with Pharmacists   Amiodorone 400 mg bid for 1 week then 200 mg bid for 1 week ./cy

## 2020-02-17 NOTE — Telephone Encounter (Signed)
    Pt c/o medication issue:  1. Name of Medication:  amiodarone (PACERONE) 200 MG tablet    2. How are you currently taking this medication (dosage and times per day)?  Take 2 tablets (400 mg total) by mouth 2 (two) times daily for 7 days, THEN 1 tablet (200 mg total) 2 (two) times daily for 7 days. Then stop  3. Are you having a reaction (difficulty breathing--STAT)?   4. What is your medication issue? Teresa from [employer] needs to clarify script for this medication. She said they need a callback asap since pt will be on vacation soon and need to fill correct prescription

## 2020-02-19 MED ORDER — APIXABAN 2.5 MG PO TABS: 2.5000 mg | ORAL_TABLET | Freq: Two times a day (BID) | ORAL | 0 refills | Status: DC

## 2020-02-23 ENCOUNTER — Telehealth: Payer: Self-pay | Admitting: *Deleted

## 2020-02-23 ENCOUNTER — Ambulatory Visit (INDEPENDENT_AMBULATORY_CARE_PROVIDER_SITE_OTHER): Payer: Medicare Other

## 2020-02-23 ENCOUNTER — Telehealth: Payer: Self-pay | Admitting: Cardiology

## 2020-02-23 DIAGNOSIS — I4891 Unspecified atrial fibrillation: Secondary | ICD-10-CM | POA: Diagnosis not present

## 2020-02-23 DIAGNOSIS — I493 Ventricular premature depolarization: Secondary | ICD-10-CM

## 2020-02-23 NOTE — Telephone Encounter (Signed)
Received   Notification that  Patient has not started  30 day monitor.  And the study will be cancelled.  Rn called an spoke to representative _Stephen   RN informed representative that patient was instructed to start wearing monitor after 02/25/20   The enrollement needs to continue.  Jeannett Senior indicated he made annotation of instructions for patient .

## 2020-02-23 NOTE — Telephone Encounter (Signed)
D/w Crystal at [employer] she notes baseline of irregular HR at 110-120 and 14 single of PVC's in <10min she will fax over report for [facility] to review.

## 2020-02-23 NOTE — Telephone Encounter (Signed)
Printed report and given to RN/DH

## 2020-02-23 NOTE — Telephone Encounter (Signed)
New message:      Crystal from [employer] calling with a abnormal EKG

## 2020-02-24 NOTE — Telephone Encounter (Signed)
Second attempt, unable to reach the patient 

## 2020-02-24 NOTE — Telephone Encounter (Signed)
Unable to reach the patient today, see detailed plan below. If his SBP is >110, may add extra 25mg  daily of metoprolol succinate to night time and keep 50mg  daily during the day for better rate control of afib.

## 2020-02-24 NOTE — Telephone Encounter (Addendum)
Received the report from RN Madelin Rear regarding this patient's [employer] heart monitor report that was faxed over yesterday.  It appears patient is still in atrial fibrillation, heart rate is borderline elevated around 110 bpm.  I left the patient a message asking him to call us back.  He was recently started on anticoagulation therapy and amiodarone therapy.  He is also on 50 mg daily of Toprol-XL, we will see if he has enough blood pressure room to add on a 25 mg tablet of Toprol-XL at night for better rate control.  I will call the patient back in 30 minutes.

## 2020-02-24 NOTE — Telephone Encounter (Signed)
Third attempt at reaching the patient was unsuccessful.

## 2020-02-26 NOTE — Telephone Encounter (Signed)
Unable to reach the patient

## 2020-02-26 NOTE — Telephone Encounter (Signed)
Unable to reach the patient again, left message instructing the patient to call us.

## 2020-02-29 NOTE — Telephone Encounter (Signed)
Called patient again and left message asking him to call us back. This is the 5th attempt at calling the patient. Will send patient a MyChart message.

## 2020-03-04 NOTE — Telephone Encounter (Signed)
Unable to contact patient. Closing encounter. Will reopen if pt contacts our office.

## 2020-03-22 ENCOUNTER — Telehealth: Payer: Self-pay | Admitting: Cardiology

## 2020-03-22 NOTE — Telephone Encounter (Signed)
Spoke with pt, he reports the battery died this morning and he would think they have 27 days of strips that are good. He reports he has not had any symptoms at all while wearing it. He is not really interested in wearing the monitor for another cycle. Discussed with shelly our monitor tech and there is 28 days of data so left message for patient to go ahead and mail the monitor back to the company.

## 2020-03-22 NOTE — Telephone Encounter (Signed)
Patient is calling to follow up regarding his heart monitor. He states today is his final day of wearing it. However, due to his monitor needing a new battery, [employer] is requesting to send the patient an at-home kit so that he can begin his cycle over. Patient assumes this will not be necessary because the monitor was able to pick up at least 27 days of information. When speaking with [employer] he declined their offer to deliver home-kit and they requested to have the patient confirm with Dr. Ellyn Hack before declining.   Patient is also following up regarding most recent patient message (02/29/20). He states he recently had an opportunity to review the message and he would like to make Dr. Ellyn Hack aware that he has not experienced any symptoms.    Please return call to discuss further.

## 2020-04-25 ENCOUNTER — Telehealth: Payer: Self-pay

## 2020-04-25 DIAGNOSIS — I4891 Unspecified atrial fibrillation: Secondary | ICD-10-CM | POA: Diagnosis not present

## 2020-04-25 DIAGNOSIS — R197 Diarrhea, unspecified: Secondary | ICD-10-CM | POA: Diagnosis not present

## 2020-04-25 DIAGNOSIS — Z86010 Personal history of colonic polyps: Secondary | ICD-10-CM | POA: Diagnosis not present

## 2020-04-25 DIAGNOSIS — E119 Type 2 diabetes mellitus without complications: Secondary | ICD-10-CM | POA: Diagnosis not present

## 2020-04-25 DIAGNOSIS — I10 Essential (primary) hypertension: Secondary | ICD-10-CM | POA: Diagnosis not present

## 2020-04-25 NOTE — Telephone Encounter (Signed)
   [facility] Pre-operative Risk Assessment    HEARTCARE STAFF: [facility]  Request for surgical clearance:  1. What type of surgery is being performed? Colonoscopy   2. When is this surgery scheduled? 05/03/2020   3. What type of clearance is required (medical clearance vs. Pharmacy clearance to hold med vs. Both)? Both  4. Are there any medications that need to be held prior to surgery and how long?Eliquis, not stated   5. Practice name and name of physician performing surgery? Tory Emerald Benson [place], MD   6. What is the office phone number? [phone]   7.   What is the office fax number? [phone]  8.   Anesthesia type (None, local, MAC, general) ? Propfol   Alvy Beal Apple 04/25/2020, 2:35 PM  _________________________________________________________________   (provider comments below)

## 2020-04-25 NOTE — Telephone Encounter (Signed)
Patient with diagnosis of afib on Eliquis for anticoagulation.    Procedure: Colonoscopy Date of procedure: 05/03/20    CHA2DS2-VASc Score = 5  This indicates a 7.2% annual risk of stroke. The patient's score is based upon: CHF History: Yes HTN History: Yes Diabetes History: Yes Stroke History: No Vascular Disease History: Yes Age Score: 1 Gender Score: 0     CrCl 92 ml/min  Per office protocol, patient can hold Eliquis for 1-2 days prior to procedure.

## 2020-04-26 ENCOUNTER — Encounter: Payer: Self-pay | Admitting: Cardiology

## 2020-04-26 ENCOUNTER — Ambulatory Visit: Payer: Medicare Other | Admitting: Cardiology

## 2020-04-26 ENCOUNTER — Other Ambulatory Visit: Payer: Self-pay

## 2020-04-26 VITALS — BP 128/62 | HR 77 | Ht 72.0 in | Wt 311.0 lb

## 2020-04-26 DIAGNOSIS — I493 Ventricular premature depolarization: Secondary | ICD-10-CM | POA: Diagnosis not present

## 2020-04-26 DIAGNOSIS — E118 Type 2 diabetes mellitus with unspecified complications: Secondary | ICD-10-CM | POA: Diagnosis not present

## 2020-04-26 DIAGNOSIS — E785 Hyperlipidemia, unspecified: Secondary | ICD-10-CM | POA: Diagnosis not present

## 2020-04-26 DIAGNOSIS — I4821 Permanent atrial fibrillation: Secondary | ICD-10-CM | POA: Diagnosis not present

## 2020-04-26 DIAGNOSIS — I10 Essential (primary) hypertension: Secondary | ICD-10-CM | POA: Diagnosis not present

## 2020-04-26 DIAGNOSIS — Z951 Presence of aortocoronary bypass graft: Secondary | ICD-10-CM | POA: Diagnosis not present

## 2020-04-26 DIAGNOSIS — I2510 Atherosclerotic heart disease of native coronary artery without angina pectoris: Secondary | ICD-10-CM

## 2020-04-26 DIAGNOSIS — I4891 Unspecified atrial fibrillation: Secondary | ICD-10-CM

## 2020-04-26 DIAGNOSIS — E1169 Type 2 diabetes mellitus with other specified complication: Secondary | ICD-10-CM

## 2020-04-26 DIAGNOSIS — Z6841 Body Mass Index (BMI) 40.0 and over, adult: Secondary | ICD-10-CM

## 2020-04-26 MED ORDER — APIXABAN 5 MG PO TABS: 5.0000 mg | ORAL_TABLET | Freq: Two times a day (BID) | ORAL | 3 refills | Status: DC

## 2020-04-26 MED ORDER — METOPROLOL SUCCINATE ER 100 MG PO TB24
100.0000 mg | ORAL_TABLET | Freq: Every day | ORAL | 3 refills | Status: DC
Start: 2020-04-26 — End: 2021-02-22

## 2020-04-26 NOTE — Patient Instructions (Signed)
Medication Instructions:   TAKE 2 OF THE 2.5 MG ELIQUIS TABLETS TWICE DAILY UNTIL YOU GET THE NEW SCRIPT FOR 5 MG TWICE DAILY  TAKE 1 OF THE 50 MG METOPROLOL TWICE DAILY UNTIL YOU GET THE METOPROLOL 100 MG TABLETS ONCE DAILY AT BEDTIME  *If you need a refill on your cardiac medications before your next appointment, please call your pharmacy   Follow-Up: At [facility], you and your health needs are our priority.  As part of our continuing mission to provide you with exceptional heart care, we have created designated Provider Care Teams.  These Care Teams include your primary Cardiologist (physician) and Advanced Practice Providers (APPs -  Physician Assistants and Nurse Practitioners) who all work together to provide you with the care you need, when you need it.  We recommend signing up for the patient portal called "MyChart".  Sign up information is provided on this After Visit Summary.  MyChart is used to connect with patients for Virtual Visits (Telemedicine).  Patients are able to view lab/test results, encounter notes, upcoming appointments, etc.  Non-urgent messages can be sent to your provider as well.   To learn more about what you can do with MyChart, go to [email or web address].    Your next appointment:   9 month(s)  The format for your next appointment:   In Person  Provider:   Bryan Lemma, MD   Other Instructions NO ELQUIS Sunday AND THEN RESTART AFTER PROCEDURE

## 2020-04-26 NOTE — Progress Notes (Signed)
Primary Care Provider: Georgianne Fick, MD Cardiologist: Bryan Lemma, MD Electrophysiologist: None  Clinic Note: Chief Complaint  Patient presents with  . Atrial Fibrillation    Follow-up 30-day event monitor and echocardiogram  . Coronary Artery Disease    No angina or heart failure  . Follow-up    Test results   Problem List Items Addressed This Visit    CAD, multiple vessel (Chronic)   S/P CABG x 4 (Chronic)   Morbid obesity with BMI of 40.0-44.9, adult (HCC) (Chronic)   Hyperlipidemia associated with type 2 diabetes mellitus (HCC) (Chronic)   Essential hypertension (Chronic)   Frequent unifocal PVCs (Chronic)   Permanent atrial fibrillation (HCC) - Primary (Chronic)     HPI:    Carl Steele is a 70 y.o. male with a PMH notable for CAD-CABG, FREQUENT PVCS and mildly reduced EF & new Dx of Essentially Persistent (Permanent) Atrial Fibrillation who presents today for ~2 month f/u to discuss study results & Afib Symptoms.  Carl Steele was last seen on 04/12/2020 for 36-month follow-up.  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  He was very active exercise routinely.  Notes that his weight go up and down but was plateauing.  He did not really feel anything unusual, was little bit frustrated because his beta-blocker had not been filled.  Unfortunately EKG showed A. fib with PVCs-he was totally asymptomatic => no symptoms of fatigue, dyspnea, chest tightness/pressure or exercise intolerance.  No palpitations or sense of irregular heartbeat.  2D echo and 30-day monitor ordered. ->    Plan was to start Eliquis, and then after 4 weeks loaded with oral amiodarone 4 mg twice daily for a week and then 20 mg twice daily for a week with hopes to cardiovert.    2 weeks after the visit he is supposed to start wearing the event monitor  (with hopes to see change/potential conversion)  Recent Hospitalizations: None  Reviewed  CV studies:    The following studies were reviewed today: (if available, images/films reviewed: From Epic Chart or Care Everywhere) . 30 D Event Monitor Nov-Dec 2021: with & without Amiodarone. => Study shows persistent atrial fibrillation with frequent PVCs and short bursts of nonsustained ventricular tachycardia.  Monitor shows essentially persistent atrial fibrillation throughout 99% of the time the monitor was worn.  Overall, minimum heart rate 53 bpm, maximum 177 bpm. Average heart rate 87 bpm.  Frequent PVCs (10%) noted. -> Average 14/min (as many as 28)  Intermittent runs of 3-10 beats of Nonsustained Ventricular Tachycardia interpolated in A. fib    TTE 02/15/2020: EF 55 to 60%.  Severe basal septal LVH with mild concentric LVH.  Unable to assess diastolic pressure because of A. fib.  Mild RV dysfunction, unable to assess TR for PA pressure management.  Moderate aortic valve calcification/sclerosis but no stenosis.  No AI.  Normal RAP  Interval History:   Carl Steele returns here today for routine follow-up in great spirits.  He has thoroughly enjoyed living down in [place].  He says he is totally relaxed, no stress.  He is enjoying being around all the people to live in his, does have.  They go to the clubhouse just about every evening and participating in 1 event or the other.  He has been exercising at [employer] doing gym machines  as well as weights.  He is doing both strength and conditioning as well as endurance exercises.  (See Social History) he feels great with, no fatigue, dyspnea or chest tightness or pressure pressure.  No sensation of irregular heartbeats or palpitations.  He never has felt any symptoms of being in A. fib.  He did not notice any real difference except for the first couple days on amiodarone feeling little fatigue.  He did not cardiovert with amiodarone.  He  has not had any angina or heart failure symptoms.  No syncope or near syncope.-He says he feels better than he has felt in years!!  CV Review of Symptoms (Summary): no chest pain or dyspnea on exertion positive for - Increased level of energy with hopeful weight loss negative for - chest pain, dyspnea on exertion, edema, irregular heartbeat, orthopnea, palpitations, paroxysmal nocturnal dyspnea, rapid heart rate, shortness of breath or Lightheadedness or dizziness, syncope/near syncope or TIA/amaurosis fugax, claudication; melena, hematochezia hematuria epistaxis.  He has noted a little bit extra bruising.  The patient does not have symptoms concerning for COVID-19 infection (fever, chills, cough, or new shortness of breath).   REVIEWED OF SYSTEMS   Review of Systems  Constitutional: Negative for malaise/fatigue and weight loss (He has lost weight, because his weight is gone up about 20 pounds over Christmas.).  HENT: Negative for nosebleeds.   Respiratory: Negative for cough and shortness of breath.   Cardiovascular: Negative for leg swelling.  Gastrointestinal: Negative for abdominal pain, blood in stool and melena.  Genitourinary: Negative for hematuria.  Musculoskeletal: Positive for joint pain (Shoulders and knees).       Minimal aches and pains in his joints.  Mostly shoulders  Neurological: Negative for dizziness, sensory change, focal weakness, weakness and headaches.  Endo/Heme/Allergies: Negative for environmental allergies. Bruises/bleeds easily (Just a little bit).  Psychiatric/Behavioral: Negative for depression and memory loss. The patient is not nervous/anxious and does not have insomnia.        He feels happy and complete now for the first time in years. ->  Thinks that he probably will be spending more time in [place] and [place] because his wife is now working full-time in [place].   I have reviewed and (if needed) personally updated the patient's problem list,  medications, allergies, past medical and surgical history, social and family history.   PAST MEDICAL HISTORY   Past Medical History:  Diagnosis Date  . CAD, multiple vessel 06/26/2010   4 atypical chest pressure/dyspnea and frequent PVCs --> no ischemia on Myoview, but significant ectopy --> cardiac cath: 80% proximal LAD, 70% D2, occluded LAD after D2, 70-80% circumflex. Diffuse RCA and PDA lesions.;; Post-CABG Myoview 2012: No ischemia no infarction. EF 55% with reduced TID  . Diabetes mellitus without complication (HCC)   . Frequent PVCs 01/2020   With 10% PVCs as many as 28/min, average 14/min. ->  Short bursts of 3-10 beats NSVT  . Hyperlipidemia   . Hypertension   . Morbid obesity with BMI of 40.0-44.9, adult (HCC)   . Permanent atrial fibrillation (HCC) 01/2020   (CHA2DS2-VASc score= 4; (CAD, HTN, DM, Age x 1): 30 D Event Monitor shows Persistent A. fib with frequent PVCs and short bursts of NSVT.  Max heart rate 177 bpm, minimum 53 bpm.  Average 87 bpm.  . 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 fib without recurrence    PAST SURGICAL HISTORY   Past Surgical  History:  Procedure Laterality Date  . CARDIAC CATHETERIZATION  March 2012   Initial Myoview - TID 1.5, frequent PVCs (in pairs & triplets), no ischemia (likely balanced ischemia) --> CATH: 80% PROX LAD, 70% D2, AND AN OCCLUDED LAD AFTER D2 , 70 -80% CIRC WITH SMALL OM2 AND DIFFUSE PROX LESIONS RCA AND PDA  . CORONARY ARTERY BYPASS GRAFT  07/07/2010   LIMA-LAD,SVG-D2,SVG-DISTAL CIRC, SVG-PDA-POST OP COMPLICATED BY AFIB BUT NO RECURRENCE; endovascular harvest of vein from right thigh; open harvest from left lower leg.  Marland Kitchen NM MYOCAR PERF WALL MOTION  09/22/2010   EF55%, TID was down t0 0.95 from 1.5 and no further arrhythmia   . NM MYOVIEW LTD  01/08/2019   LOW RISK. No ischemia or infarction. EF roughly 50%.  . TRANSTHORACIC ECHOCARDIOGRAM  02/15/2020   EF 55 to 60%.  Severe  basal septal LVH with mild concentric LVH.  Unable to assess diastolic pressure because of A. fib.  Mild RV dysfunction, unable to assess TR for PA pressure management.  Moderate aortic valve calcification/sclerosis but no stenosis.  No AI.  Normal RAP  . TRANSTHORACIC ECHOCARDIOGRAM  JUNE 2012   MILD DIASTOLIC DYSFUNCTION WITH NORMAL  EF > 55% ,MILD LEFT ATRIAL AND RIGHT ATRIAL DILATION ,MILD TO MODERATRE MITARL REGURG AND AORTIC  VALVE SCLEROSIS    Immunization History  Administered Date(s) Administered  . PFIZER(Purple Top)SARS-COV-2 Vaccination 06/28/2019, 07/18/2019  He did get the booster  MEDICATIONS/ALLERGIES   Current Meds  Medication Sig  . atorvastatin (LIPITOR) 80 MG tablet Take 80 mg by mouth daily.  Marland Kitchen ezetimibe (ZETIA) 10 MG tablet Take 10 mg by mouth daily.  . metFORMIN (GLUCOPHAGE-XR) 500 MG 24 hr tablet Take 500 mg by mouth 2 (two) times daily.   . valsartan (DIOVAN) 80 MG tablet Take 80 mg by mouth daily.  . [DISCONTINUED] apixaban (ELIQUIS) 2.5 MG TABS tablet Take 2.5 mg by mouth 2 (two) times daily.  . [DISCONTINUED] metoprolol succinate (TOPROL-XL) 50 MG 24 hr tablet Take 1 tablet (50 mg total) by mouth daily. Take with or immediately following a meal.    No Known Allergies  SOCIAL HISTORY/FAMILY HISTORY   Reviewed in Epic:  Pertinent findings: No changes to family history Social History   Tobacco Use  . Smoking status: Never Smoker  . Smokeless tobacco: Never Used  Vaping Use  . Vaping Use: Never used  Substance Use Topics  . Alcohol use: No  . 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      He is now retired, and living part-time in a new condo just outside of [place].  His wife has just started a job down in [place].  They intend to stay there as long as the son is there.  The intention is to eventually move to the location where he  ends up.   Carl Steele is thriving with living in [place].  He enjoys going to the club affiliated with his condo center.  He is surrounded with multiple elderly couples that enjoy the same pastimes.  He will enjoy his playing cards, bingo as well as other gym activities.  He spends many mornings fishing.      He still works out vigorously at [employer] for least an hour a day does help heavy weights but also does elliptical etc.  OBJCTIVE -PE, EKG, labs   Wt Readings from Last 3 Encounters:  04/26/20 (!) 311 lb (141.1 kg)  02/11/20 (!) 312 lb 9.6 oz (141.8 kg)  07/24/19 (!) 310 lb (140.6 kg)    Physical Exam: BP 128/62   Pulse 77   Ht 6' (1.829 m)   Wt (!) 311 lb (141.1 kg)   SpO2 97%   BMI 42.18 kg/m  Physical Exam Vitals reviewed.  Constitutional:      General: He is not in acute distress.    Appearance: He is not ill-appearing or toxic-appearing.     Comments: Still morbidly obese; well-groomed.  HENT:     Head: Normocephalic and atraumatic.  Neck:     Vascular: No carotid bruit, hepatojugular reflux or JVD.  Cardiovascular:     Rate and Rhythm: Normal rate. Rhythm irregularly irregular. Occasional extrasystoles are present.    Chest Wall: PMI is not displaced (Unable to assess).     Pulses: Decreased pulses (Difficult to palpate pedal pulses, but present; feet are warm to touch).     Heart sounds: S1 normal and S2 normal. Heart sounds are distant. Murmur (Soft 1/6 HSM at apex) heard.  No friction rub. No gallop.   Pulmonary:     Effort: Pulmonary effort is normal. No respiratory distress.     Breath sounds: Normal breath sounds.  Chest:     Chest wall: No tenderness.  Musculoskeletal:        General: Swelling (Trivial) present.     Cervical back: Normal range of motion and neck supple.  Neurological:     General: No focal deficit present.     Mental Status: He is alert and oriented to person, place, and time.  Psychiatric:        Mood and Affect: Mood normal.         Behavior: Behavior normal.        Thought Content: Thought content normal.        Judgment: Judgment normal.     Adult ECG Report  Rate: 77;  Rhythm: atrial fibrillation and With PVCs versus aberrantly conducted beats.  Otherwise normal axis, intervals and durations.;   Narrative Interpretation: Consistent with previous EKG  Recent Labs:    10/20/2019: TC 142, TG 94, HDL 42, LDL 82.  A1c 7.0,  Cr 1.1, K+ 4.3; TSH 1.79   ASSESSMENT/PLAN    Problem List Items Addressed This Visit    CAD, multiple vessel (Chronic)    Almost 10 years out from his bypass surgery for multivessel CAD.  He has not had any anginal symptoms to speak of.  Tolerating A. fib RVR indication.  This would indicate a negative stress test.  Last Myoview was in September 2020-would not be due until 2024.  Plan:  Continue Toprol (at increased dose of 100 mg) along with valsartan  Since he is now on Eliquis, we can probably stop aspirin at next visit,  Continue atorvastatin at current dose.      Relevant Medications   ezetimibe (ZETIA) 10 MG tablet   apixaban (ELIQUIS) 5 MG TABS tablet   metoprolol succinate (TOPROL-XL) 100 MG 24 hr tablet   S/P CABG x 4 (Chronic)    No angina, negative Myoview in 2020. Tolerating A. fib RVR with no anginal symptoms.  Follow-up Myoview in 2024      Morbid obesity with BMI of 40.0-44.9, adult (HCC) (Chronic)    He is now definitely picking up his exercise.  He is hoping that also  the diet changes will take and that he will now be to lose some weight.  He is hoping this will help as A1c and lipids as well.      Permanent atrial fibrillation (HCC); CHA2DS2-VASc Score = 4-> Eliquis - Primary (Chronic)    Monitor showed essentially permanent A. fib.  Did not convert with amiodarone.  At this point, with her not having any significant symptoms, he would prefer to avoid being on amiodarone (or other antiarrhythmic) long-term.  He is also not in favor of A. fib ablation in the  absence of symptoms.  Plan:   DC amiodarone, increase Toprol to 100 mg daily.  Increase to 5 mg Eliquis (but hold until he has his GI procedure); initial prescription has been for increased dose.        Relevant Medications   ezetimibe (ZETIA) 10 MG tablet   apixaban (ELIQUIS) 5 MG TABS tablet   metoprolol succinate (TOPROL-XL) 100 MG 24 hr tablet   Other Relevant Orders   EKG 12-Lead (Completed)   Type II diabetes mellitus with complication (HCC) (Chronic)    Only on metformin.  As long as doing well, would continue metformin alone, however would consider the addition of SGLT2 inhibitor.      Hyperlipidemia associated with type 2 diabetes mellitus (HCC) (Chronic)    Most recent LDL was 82.  Not quite at goal.  Now on 80 mg of atorvastatin.  Easily continue to exercise and watch his diet.  We will reassess in the fall.      Relevant Medications   ezetimibe (ZETIA) 10 MG tablet   Essential hypertension (Chronic)    Blood pressure is well within normal range today.  I am can increase his Toprol to 100 mg daily, monitor blood pressures, may need to reduce ARB dose.      Relevant Medications   ezetimibe (ZETIA) 10 MG tablet   apixaban (ELIQUIS) 5 MG TABS tablet   metoprolol succinate (TOPROL-XL) 100 MG 24 hr tablet   Frequent unifocal PVCs (Chronic)    Interestingly, he has 10% PVCs, but no symptoms.  Echo was relatively normal as was his Myoview last year.  In the absence of symptoms, will be reluctant to consider PVC ablation, however if he were to have symptoms, perhaps PVC and A. fib ablation would be an option. Amiodarone really did not make them any less prominent.  Plan:  DC amiodarone and increase Toprol to 100 mg daily.       Relevant Medications   ezetimibe (ZETIA) 10 MG tablet   apixaban (ELIQUIS) 5 MG TABS tablet   metoprolol succinate (TOPROL-XL) 100 MG 24 hr tablet     =====================================================================  COVID-19  Education: The signs and symptoms of COVID-19 were discussed with the patient and how to seek care for testing (follow up with PCP or arrange E-visit).   The importance of social distancing and COVID-19 vaccination was discussed today.  The patient is practicing social distancing & Masking.   I spent a total of with the patient spent in direct patient consultation.  Additional time spent with chart review  / charting (studies, outside notes, etc): 22 min Total Time: 78 min   Current medicines are reviewed at length with the patient today.  (+/- concerns) n/a  This visit occurred during the SARS-CoV-2 public health emergency.  Safety protocols were in place, including screening questions prior to the visit, additional usage of staff PPE, and extensive cleaning of exam room while observing appropriate  contact time as indicated for disinfecting solutions.  Notice: This dictation was prepared with Dragon dictation along with smaller phrase technology. Any transcriptional errors that result from this process are unintentional and may not be corrected upon review.  Patient Instructions / Medication Changes & Studies & Tests Ordered   Patient Instructions  Medication Instructions:   TAKE 2 OF THE 2.5 MG ELIQUIS TABLETS TWICE DAILY UNTIL YOU GET THE NEW SCRIPT FOR 5 MG TWICE DAILY  TAKE 1 OF THE 50 MG METOPROLOL TWICE DAILY UNTIL YOU GET THE METOPROLOL 100 MG TABLETS ONCE DAILY AT BEDTIME  *If you need a refill on your cardiac medications before your next appointment, please call your pharmacy   Follow-Up: At [facility], you and your health needs are our priority.  As part of our continuing mission to provide you with exceptional heart care, we have created designated Provider Care Teams.  These Care Teams include your primary Cardiologist (physician) and Advanced Practice Providers (APPs -  Physician Assistants and Nurse Practitioners) who all work together to provide you with the  care you need, when you need it.  We recommend signing up for the patient portal called "MyChart".  Sign up information is provided on this After Visit Summary.  MyChart is used to connect with patients for Virtual Visits (Telemedicine).  Patients are able to view lab/test results, encounter notes, upcoming appointments, etc.  Non-urgent messages can be sent to your provider as well.   To learn more about what you can do with MyChart, go to [email or web address].    Your next appointment:   9 month(s)  The format for your next appointment:   In Person  Provider:   Bryan Lemma, MD   Other Instructions NO ELQUIS Sunday AND THEN RESTART AFTER PROCEDURE     Studies Ordered:   Orders Placed This Encounter  Procedures  . EKG 12-Lead     Bryan Lemma, M.D., M.S. Interventional Cardiologist   Pager # [phone] Phone # [phone] [address] [place], [place] [address]   Thank you for choosing [facility] at [facility]!!

## 2020-04-26 NOTE — Telephone Encounter (Signed)
Left message for the patient to call back and speak to the preop APP. 

## 2020-04-27 NOTE — Telephone Encounter (Signed)
   Primary Cardiologist: Bryan Lemma, MD  Chart reviewed as part of pre-operative protocol coverage. Given past medical history and time since last visit, based on ACC/AHA guidelines, Carl Steele would be at acceptable risk for the planned procedure without further cardiovascular testing.   The patient was advised that if he develops new symptoms prior to surgery to contact our office to arrange for a follow-up visit, and he verbalized understanding.  I will route this recommendation to the requesting party via Epic fax function and remove from pre-op pool.  Please call with questions.  [place], [place] 04/27/2020, 10:58 AM

## 2020-04-28 DIAGNOSIS — E782 Mixed hyperlipidemia: Secondary | ICD-10-CM | POA: Diagnosis not present

## 2020-04-28 DIAGNOSIS — E1165 Type 2 diabetes mellitus with hyperglycemia: Secondary | ICD-10-CM | POA: Diagnosis not present

## 2020-04-28 DIAGNOSIS — I10 Essential (primary) hypertension: Secondary | ICD-10-CM | POA: Diagnosis not present

## 2020-04-28 DIAGNOSIS — I2510 Atherosclerotic heart disease of native coronary artery without angina pectoris: Secondary | ICD-10-CM | POA: Diagnosis not present

## 2020-04-28 DIAGNOSIS — I739 Peripheral vascular disease, unspecified: Secondary | ICD-10-CM | POA: Diagnosis not present

## 2020-05-05 ENCOUNTER — Encounter: Payer: Self-pay | Admitting: Cardiology

## 2020-05-05 NOTE — Assessment & Plan Note (Signed)
Most recent LDL was 82.  Not quite at goal.  Now on 80 mg of atorvastatin.  Easily continue to exercise and watch his diet.  We will reassess in the fall.

## 2020-05-05 NOTE — Assessment & Plan Note (Signed)
No angina, negative Myoview in 2020. Tolerating A. fib RVR with no anginal symptoms.  Follow-up Myoview in 2024

## 2020-05-05 NOTE — Assessment & Plan Note (Addendum)
Monitor showed essentially permanent A. fib.  Did not convert with amiodarone.  At this point, with her not having any significant symptoms, he would prefer to avoid being on amiodarone (or other antiarrhythmic) long-term.  He is also not in favor of A. fib ablation in the absence of symptoms.  Plan:   DC amiodarone, increase Toprol to 100 mg daily.  Increase to 5 mg Eliquis (but hold until he has his GI procedure); initial prescription has been for increased dose.

## 2020-05-05 NOTE — Assessment & Plan Note (Signed)
Almost 10 years out from his bypass surgery for multivessel CAD.  He has not had any anginal symptoms to speak of.  Tolerating A. fib RVR indication.  This would indicate a negative stress test.  Last Myoview was in September 2020-would not be due until 2024.  Plan:  Continue Toprol (at increased dose of 100 mg) along with valsartan  Since he is now on Eliquis, we can probably stop aspirin at next visit,  Continue atorvastatin at current dose.

## 2020-05-05 NOTE — Assessment & Plan Note (Signed)
Only on metformin.  As long as doing well, would continue metformin alone, however would consider the addition of SGLT2 inhibitor.

## 2020-05-05 NOTE — Assessment & Plan Note (Signed)
Interestingly, he has 10% PVCs, but no symptoms.  Echo was relatively normal as was his Myoview last year.  In the absence of symptoms, will be reluctant to consider PVC ablation, however if he were to have symptoms, perhaps PVC and A. fib ablation would be an option. Amiodarone really did not make them any less prominent.  Plan:  DC amiodarone and increase Toprol to 100 mg daily.

## 2020-05-05 NOTE — Assessment & Plan Note (Signed)
He is now definitely picking up his exercise.  He is hoping that also the diet changes will take and that he will now be to lose some weight.  He is hoping this will help as A1c and lipids as well.

## 2020-05-05 NOTE — Assessment & Plan Note (Signed)
Blood pressure is well within normal range today.  I am can increase his Toprol to 100 mg daily, monitor blood pressures, may need to reduce ARB dose.

## 2020-05-09 ENCOUNTER — Ambulatory Visit: Payer: Medicare Other | Admitting: Cardiology

## 2020-05-13 ENCOUNTER — Telehealth: Payer: Self-pay | Admitting: Cardiology

## 2020-05-13 NOTE — Telephone Encounter (Signed)
Patient independently stopped eliquis 2 days before dental extraction. Patient is having dental pain and requires urgent dental care per his Dentist who I spoke to on the phone.   Per Dr. Dr. Jacques Navy -"Our recommendation would have been continuous anticoagulation throughout the period of extraction, however since the patient has independently stopped this, would be reasonable to proceed today. He will need instructions on when to resume Eliquis from his dentist after extraction. Would recommend resuming as soon as is safe from bleeding standpoint".

## 2020-05-13 NOTE — Telephone Encounter (Signed)
This message has been E-Routed to Dr. Domenic Schwab DDS.

## 2020-05-13 NOTE — Telephone Encounter (Signed)
1. What dental office are you calling from? Dr. Domenic Schwab DDS  2. What is your office phone number? [phone]  3. What is your fax number? [phone]  4. What type of procedure is the patient having performed? Extraction of one tooth  5. What date is procedure scheduled or is the patient there now? Patient is there now  (if the patient is at the dentist's office question goes to their cardiologist if he/she is in the office.  If not, question should go to the DOD).   6. What is your question (ex. Antibiotics prior to procedure, holding medication-we need to know how long dentist wants pt to hold med)? Patient stopped his eliquis on his own 2 days ago. They need in writing how many days before and after to hold the eliquis.

## 2020-06-13 DIAGNOSIS — E1165 Type 2 diabetes mellitus with hyperglycemia: Secondary | ICD-10-CM | POA: Diagnosis not present

## 2020-06-13 DIAGNOSIS — I10 Essential (primary) hypertension: Secondary | ICD-10-CM | POA: Diagnosis not present

## 2020-06-13 DIAGNOSIS — E782 Mixed hyperlipidemia: Secondary | ICD-10-CM | POA: Diagnosis not present

## 2020-06-13 DIAGNOSIS — I4821 Permanent atrial fibrillation: Secondary | ICD-10-CM | POA: Diagnosis not present

## 2020-11-23 ENCOUNTER — Other Ambulatory Visit: Payer: Self-pay | Admitting: Gastroenterology

## 2020-12-09 NOTE — Progress Notes (Signed)
Attempted to obtain medical history via telephone, unable to reach at this time. I left a voicemail to return pre surgical testing department's phone call.  

## 2020-12-13 ENCOUNTER — Other Ambulatory Visit: Payer: Self-pay

## 2020-12-16 ENCOUNTER — Encounter (HOSPITAL_COMMUNITY)
Admission: RE | Disposition: A | Discharge status: Home / Self Care | Payer: Self-pay | Source: Home / Self Care | Attending: Gastroenterology

## 2020-12-16 ENCOUNTER — Encounter (HOSPITAL_COMMUNITY): Payer: Self-pay | Admitting: Gastroenterology

## 2020-12-16 ENCOUNTER — Ambulatory Visit (HOSPITAL_COMMUNITY)
Admission: RE | Admit: 2020-12-16 | Discharge: 2020-12-16 | Disposition: A | Discharge status: Home / Self Care | Payer: Medicare Other | Attending: Gastroenterology | Admitting: Gastroenterology

## 2020-12-16 ENCOUNTER — Ambulatory Visit (HOSPITAL_COMMUNITY): Payer: Medicare Other | Admitting: Anesthesiology

## 2020-12-16 ENCOUNTER — Other Ambulatory Visit: Payer: Self-pay

## 2020-12-16 DIAGNOSIS — I2510 Atherosclerotic heart disease of native coronary artery without angina pectoris: Secondary | ICD-10-CM | POA: Diagnosis not present

## 2020-12-16 DIAGNOSIS — Z6841 Body Mass Index (BMI) 40.0 and over, adult: Secondary | ICD-10-CM | POA: Insufficient documentation

## 2020-12-16 DIAGNOSIS — K5730 Diverticulosis of large intestine without perforation or abscess without bleeding: Secondary | ICD-10-CM | POA: Insufficient documentation

## 2020-12-16 DIAGNOSIS — E785 Hyperlipidemia, unspecified: Secondary | ICD-10-CM | POA: Diagnosis not present

## 2020-12-16 DIAGNOSIS — Z833 Family history of diabetes mellitus: Secondary | ICD-10-CM | POA: Diagnosis not present

## 2020-12-16 DIAGNOSIS — E119 Type 2 diabetes mellitus without complications: Secondary | ICD-10-CM | POA: Diagnosis not present

## 2020-12-16 DIAGNOSIS — I10 Essential (primary) hypertension: Secondary | ICD-10-CM | POA: Insufficient documentation

## 2020-12-16 DIAGNOSIS — I4821 Permanent atrial fibrillation: Secondary | ICD-10-CM | POA: Insufficient documentation

## 2020-12-16 DIAGNOSIS — Z1211 Encounter for screening for malignant neoplasm of colon: Secondary | ICD-10-CM | POA: Diagnosis not present

## 2020-12-16 DIAGNOSIS — Z86010 Personal history of colonic polyps: Secondary | ICD-10-CM | POA: Diagnosis not present

## 2020-12-16 DIAGNOSIS — Z951 Presence of aortocoronary bypass graft: Secondary | ICD-10-CM | POA: Diagnosis not present

## 2020-12-16 HISTORY — PX: COLONOSCOPY WITH PROPOFOL: SHX5780

## 2020-12-16 LAB — POCT I-STAT, CHEM 8
BUN: 22 mg/dL (ref 8–23)
Calcium, Ion: 1.13 mmol/L — ABNORMAL LOW (ref 1.15–1.40)
Chloride: 102 mmol/L (ref 98–111)
Creatinine, Ser: 1.1 mg/dL (ref 0.61–1.24)
Glucose, Bld: 112 mg/dL — ABNORMAL HIGH (ref 70–99)
HCT: 44 % (ref 39.0–52.0)
Hemoglobin: 15 g/dL (ref 13.0–17.0)
Potassium: 4.1 mmol/L (ref 3.5–5.1)
Sodium: 139 mmol/L (ref 135–145)
TCO2: 24 mmol/L (ref 22–32)

## 2020-12-16 SURGERY — COLONOSCOPY WITH PROPOFOL
Anesthesia: Monitor Anesthesia Care

## 2020-12-16 MED ORDER — PROPOFOL 10 MG/ML IV BOLUS
INTRAVENOUS | Status: DC | PRN
  Administered 2020-12-16: 40 mg via INTRAVENOUS

## 2020-12-16 MED ORDER — PROPOFOL 500 MG/50ML IV EMUL
INTRAVENOUS | Status: DC | PRN
  Administered 2020-12-16: 100 ug/kg/min via INTRAVENOUS

## 2020-12-16 MED ORDER — SODIUM CHLORIDE 0.9 % IV SOLN: INTRAVENOUS | Status: DC

## 2020-12-16 MED ORDER — LACTATED RINGERS IV SOLN
INTRAVENOUS | Status: DC
  Administered 2020-12-16: 1000 mL via INTRAVENOUS

## 2020-12-16 MED ORDER — LIDOCAINE 2% (20 MG/ML) 5 ML SYRINGE
INTRAMUSCULAR | Status: DC | PRN
  Administered 2020-12-16: 100 mg via INTRAVENOUS

## 2020-12-16 SURGICAL SUPPLY — 21 items

## 2020-12-16 NOTE — H&P (Signed)
Carl Steele HPI: At this time the patient denies any problems with nausea, vomiting, fevers, chills, abdominal pain, constipation, hematochezia, melena, GERD, or dysphagia. The patient denies any known family history of colon cancers. No complaints of chest pain, SOB, MI, or sleep apnea. His colonoscopy on 12/22/20216 was positive for one adenoma. For the past year he noticed that he was having more bowel movements, looser stools. He recalls that his metformin was increased from 500 mg QD to BID. Of late it appears that his stools are improving.  Past Medical History:  Diagnosis Date   CAD, multiple vessel 06/26/2010   4 atypical chest pressure/dyspnea and frequent PVCs --> no ischemia on Myoview, but significant ectopy --> cardiac cath: 80% proximal LAD, 70% D2, occluded LAD after D2, 70-80% circumflex. Diffuse RCA and PDA lesions.;; Post-CABG Myoview 2012: No ischemia no infarction. 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 ->  Short bursts of 3-10 beats NSVT   Hyperlipidemia    Hypertension    Morbid obesity with BMI of 40.0-44.9, adult (HCC)    Permanent atrial fibrillation (HCC) 01/2020   (CHA2DS2-VASc score= 4; (CAD, HTN, DM, Age x 1): 30 D Event Monitor shows Persistent A. fib with frequent PVCs and short bursts of NSVT.  Max heart rate 177 bpm, minimum 53 bpm.  Average 87 bpm.   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 fib without recurrence    Past Surgical History:  Procedure Laterality Date   CARDIAC CATHETERIZATION  March 2012   Initial Myoview - TID 1.5, frequent PVCs (in pairs & triplets), no ischemia (likely balanced ischemia) --> CATH: 80% PROX LAD, 70% D2, AND AN OCCLUDED LAD AFTER D2 , 70 -80% CIRC WITH SMALL OM2 AND DIFFUSE PROX LESIONS RCA AND PDA   CORONARY ARTERY BYPASS GRAFT  07/07/2010   LIMA-LAD,SVG-D2,SVG-DISTAL  CIRC, SVG-PDA-POST OP COMPLICATED BY AFIB BUT NO RECURRENCE; endovascular harvest of vein from right thigh; open harvest from left lower leg.   NM MYOCAR PERF WALL MOTION  09/22/2010   EF55%, TID was down t0 0.95 from 1.5 and no further arrhythmia    NM MYOVIEW LTD  01/08/2019   LOW RISK. No ischemia or infarction. EF roughly 50%.   TRANSTHORACIC ECHOCARDIOGRAM  02/15/2020   EF 55 to 60%.  Severe basal septal LVH with mild concentric LVH.  Unable to assess diastolic pressure because of A. fib.  Mild RV dysfunction, unable to assess TR for PA pressure management.  Moderate aortic valve calcification/sclerosis but no stenosis.  No AI.  Normal RAP   TRANSTHORACIC ECHOCARDIOGRAM  JUNE 2012   MILD DIASTOLIC DYSFUNCTION WITH NORMAL  EF > 55% ,MILD LEFT ATRIAL AND RIGHT ATRIAL DILATION ,MILD TO MODERATRE MITARL REGURG AND AORTIC  VALVE SCLEROSIS    Family History  Problem Relation Age of Onset   Diabetes Father     Social History:  reports that he has never smoked. He has never used smokeless tobacco. He reports that he does not drink alcohol and does not use drugs.  Allergies: No Known Allergies  Medications: Scheduled: Continuous:  sodium chloride     lactated ringers      No results found for this or any previous visit (from the past 24 hour(s)).   No results found.  ROS:  As stated above in the HPI otherwise negative.  Height 6' (1.829 m), weight (!) 140.6 kg.    PE: Gen: NAD, Alert and Oriented HEENT:  [place]/AT, EOMI Neck: Supple, no LAD Lungs: CTA Bilaterally CV: RRR without M/G/R ABD: Soft, NTND, +BS Ext: No C/C/E  Assessment/Plan: 1) Personal history of polyps - colonoscopy.  Melissa Tomaselli D 12/16/2020, 6:49 AM

## 2020-12-16 NOTE — Discharge Instructions (Signed)

## 2020-12-16 NOTE — Op Note (Signed)
[facility] Patient Name: Carl LoyalRichard Meanor Procedure Date: 12/16/2020 MRN: [id number] Attending MD: Jeani HawkingPatrick Rhythm Wigfall , MD Date of Birth: 01/24/1951 CSN: [id number] Age: 70 Admit Type: Outpatient Procedure:                Colonoscopy Indications:              High risk colon cancer surveillance: Personal                            history of colonic polyps Providers:                Jeani HawkingPatrick Brennyn Ortlieb, MD, Estella HuskJarmila Fucs RN, RN, Lawson Radararlene                            Davis, Technician Referring MD:              Medicines:                Propofol per Anesthesia Complications:            No immediate complications. Estimated Blood Loss:     Estimated blood loss: none. Procedure:                Pre-Anesthesia Assessment:                           - Prior to the procedure, a History and Physical                            was performed, and patient medications and                            allergies were reviewed. The patient's tolerance of                            previous anesthesia was also reviewed. The risks                            and benefits of the procedure and the sedation                            options and risks were discussed with the patient.                            All questions were answered, and informed consent                            was obtained. Prior Anticoagulants: The patient has                            taken no previous anticoagulant or antiplatelet                            agents. ASA Grade Assessment: III - A patient with                            severe  systemic disease. After reviewing the risks                            and benefits, the patient was deemed in                            satisfactory condition to undergo the procedure.                           - Sedation was administered by an anesthesia                            professional. Deep sedation was attained.                           After obtaining informed consent, the  colonoscope                            was passed under direct vision. Throughout the                            procedure, the patient's blood pressure, pulse, and                            oxygen saturations were monitored continuously. The                            CF-HQ190L ([id number]) Olympus colonoscope was                            introduced through the anus and advanced to the the                            cecum, identified by appendiceal orifice and                            ileocecal valve. The colonoscopy was somewhat                            difficult due to significant looping. Successful                            completion of the procedure was aided by using                            manual pressure and straightening and shortening                            the scope to obtain bowel loop reduction. The                            patient tolerated the procedure well. The quality  of the bowel preparation was good. The ileocecal                            valve, appendiceal orifice, and rectum were                            photographed. Scope In: 9:41:59 AM Scope Out: 9:59:17 AM Scope Withdrawal Time: 0 hours 11 minutes 27 seconds  Total Procedure Duration: 0 hours 17 minutes 18 seconds  Findings:      Scattered small and large-mouthed diverticula were found in the sigmoid       colon. In the ascending colon there was the prior tattoo sites (2) and       no evidence of any polyp recurrence. Impression:               - Diverticulosis in the sigmoid colon.                           - No specimens collected. Moderate Sedation:      Not Applicable - Patient had care per Anesthesia. Recommendation:           - Patient has a contact number available for                            emergencies. The signs and symptoms of potential                            delayed complications were discussed with the                            patient. Return  to normal activities tomorrow.                            Written discharge instructions were provided to the                            patient.                           - Resume previous diet.                           - Continue present medications.                           - Repeat colonoscopy in 7 years for surveillance,                            if clinically appropriate. Procedure Code(s):        --- Professional ---                           [phone], Colonoscopy, flexible; diagnostic, including                            collection of specimen(s) by brushing or washing,  when performed (separate procedure) Diagnosis Code(s):        --- Professional ---                           Z86.010, Personal history of colonic polyps                           K57.30, Diverticulosis of large intestine without                            perforation or abscess without bleeding CPT copyright 2019 American Medical Association. All rights reserved. The codes documented in this report are preliminary and upon coder review may  be revised to meet current compliance requirements. Jeani Hawking, MD Jeani Hawking, MD 12/16/2020 10:08:16 AM This report has been signed electronically. Number of Addenda: 0

## 2020-12-16 NOTE — Anesthesia Preprocedure Evaluation (Signed)
Anesthesia Evaluation  Patient identified by MRN, date of birth, ID band Patient awake    Reviewed: Allergy & Precautions, NPO status , Patient's Chart, lab work & pertinent test results  History of Anesthesia Complications Negative for: history of anesthetic complications  Airway Mallampati: II  TM Distance: >3 FB Neck ROM: Full    Dental   Pulmonary neg pulmonary ROS,    Pulmonary exam normal        Cardiovascular hypertension, Pt. on medications and Pt. on home beta blockers + CAD and + CABG (2012)  Normal cardiovascular exam+ dysrhythmias Atrial Fibrillation    Echo 02/15/20: EF 55-60%, severe LVH, no hemodynamically significant valve dysfunction  Normal myoview 2020   Neuro/Psych negative neurological ROS     GI/Hepatic negative GI ROS, Neg liver ROS,   Endo/Other  diabetes, Oral Hypoglycemic AgentsMorbid obesity  Renal/GU negative Renal ROS  negative genitourinary   Musculoskeletal negative musculoskeletal ROS (+)   Abdominal   Peds  Hematology Eliquis   Anesthesia Other Findings   Reproductive/Obstetrics                            Anesthesia Physical Anesthesia Plan  ASA: 3  Anesthesia Plan: MAC   Post-op Pain Management:    Induction: Intravenous  PONV Risk Score and Plan: 1 and Propofol infusion, TIVA and Treatment may vary due to age or medical condition  Airway Management Planned: Natural Airway, Nasal Cannula and Simple Face Mask  Additional Equipment: None  Intra-op Plan:   Post-operative Plan:   Informed Consent: I have reviewed the patients History and Physical, chart, labs and discussed the procedure including the risks, benefits and alternatives for the proposed anesthesia with the patient or authorized representative who has indicated his/her understanding and acceptance.       Plan Discussed with:   Anesthesia Plan Comments:         Anesthesia  Quick Evaluation

## 2020-12-16 NOTE — Anesthesia Postprocedure Evaluation (Signed)
Anesthesia Post Note  Patient: Carl Steele  Procedure(s) Performed: COLONOSCOPY WITH PROPOFOL     Patient location during evaluation: Endoscopy Anesthesia Type: MAC Level of consciousness: awake and alert Pain management: pain level controlled Vital Signs Assessment: post-procedure vital signs reviewed and stable Respiratory status: spontaneous breathing, nonlabored ventilation and respiratory function stable Cardiovascular status: blood pressure returned to baseline and stable Postop Assessment: no apparent nausea or vomiting Anesthetic complications: no   No notable events documented.  Last Vitals:  Vitals:   12/16/20 1010 12/16/20 1020  BP: (!) 105/54 115/64  Pulse: (!) 42 85  Resp: 19 (!) 23  Temp:    SpO2: 98% 95%    Last Pain:  Vitals:   12/16/20 1020  TempSrc:   PainSc: 0-No pain                 Lidia Collum

## 2020-12-16 NOTE — Transfer of Care (Signed)
Immediate Anesthesia Transfer of Care Note  Patient: Raunel Dimartino  Procedure(s) Performed: COLONOSCOPY WITH PROPOFOL  Patient Location: Endoscopy Unit  Anesthesia Type:MAC  Level of Consciousness: awake, alert  and oriented  Airway & Oxygen Therapy: Patient Spontanous Breathing and Patient connected to face mask oxygen  Post-op Assessment: Report given to RN and Post -op Vital signs reviewed and stable  Post vital signs: Reviewed and stable  Last Vitals:  Vitals Value Taken Time  BP 122/68   Temp    Pulse 88 12/16/20 1006  Resp 20 12/16/20 1006  SpO2 100 % 12/16/20 1006  Vitals shown include unvalidated device data.  Last Pain:  Vitals:   12/16/20 0915  TempSrc: Temporal  PainSc: 0-No pain         Complications: No notable events documented.

## 2020-12-20 ENCOUNTER — Encounter (HOSPITAL_COMMUNITY): Payer: Self-pay | Admitting: Gastroenterology

## 2020-12-20 DIAGNOSIS — Z0000 Encounter for general adult medical examination without abnormal findings: Secondary | ICD-10-CM | POA: Diagnosis not present

## 2020-12-20 DIAGNOSIS — E782 Mixed hyperlipidemia: Secondary | ICD-10-CM | POA: Diagnosis not present

## 2020-12-20 DIAGNOSIS — E1165 Type 2 diabetes mellitus with hyperglycemia: Secondary | ICD-10-CM | POA: Diagnosis not present

## 2020-12-20 DIAGNOSIS — R39198 Other difficulties with micturition: Secondary | ICD-10-CM | POA: Diagnosis not present

## 2020-12-20 DIAGNOSIS — R5383 Other fatigue: Secondary | ICD-10-CM | POA: Diagnosis not present

## 2020-12-20 DIAGNOSIS — I2510 Atherosclerotic heart disease of native coronary artery without angina pectoris: Secondary | ICD-10-CM | POA: Diagnosis not present

## 2020-12-26 DIAGNOSIS — I739 Peripheral vascular disease, unspecified: Secondary | ICD-10-CM | POA: Diagnosis not present

## 2020-12-26 DIAGNOSIS — D692 Other nonthrombocytopenic purpura: Secondary | ICD-10-CM | POA: Diagnosis not present

## 2020-12-26 DIAGNOSIS — I2510 Atherosclerotic heart disease of native coronary artery without angina pectoris: Secondary | ICD-10-CM | POA: Diagnosis not present

## 2020-12-26 DIAGNOSIS — I10 Essential (primary) hypertension: Secondary | ICD-10-CM | POA: Diagnosis not present

## 2020-12-26 DIAGNOSIS — I4821 Permanent atrial fibrillation: Secondary | ICD-10-CM | POA: Diagnosis not present

## 2020-12-26 DIAGNOSIS — E782 Mixed hyperlipidemia: Secondary | ICD-10-CM | POA: Diagnosis not present

## 2020-12-26 DIAGNOSIS — Z0000 Encounter for general adult medical examination without abnormal findings: Secondary | ICD-10-CM | POA: Diagnosis not present

## 2020-12-26 DIAGNOSIS — E1165 Type 2 diabetes mellitus with hyperglycemia: Secondary | ICD-10-CM | POA: Diagnosis not present

## 2021-02-21 ENCOUNTER — Other Ambulatory Visit: Payer: Self-pay | Admitting: Cardiology

## 2021-02-22 NOTE — Telephone Encounter (Signed)
Prescription refill request for Eliquis received. Indication:Afib Last office visit:1/22 Scr:1.1 Age: 70 Weight:137.9 kg  Prescription refilled

## 2021-03-15 DIAGNOSIS — I10 Essential (primary) hypertension: Secondary | ICD-10-CM | POA: Diagnosis not present

## 2021-03-15 DIAGNOSIS — E7849 Other hyperlipidemia: Secondary | ICD-10-CM | POA: Diagnosis not present

## 2021-03-15 DIAGNOSIS — I2510 Atherosclerotic heart disease of native coronary artery without angina pectoris: Secondary | ICD-10-CM | POA: Diagnosis not present

## 2021-03-15 DIAGNOSIS — E1165 Type 2 diabetes mellitus with hyperglycemia: Secondary | ICD-10-CM | POA: Diagnosis not present

## 2021-07-17 DIAGNOSIS — I2510 Atherosclerotic heart disease of native coronary artery without angina pectoris: Secondary | ICD-10-CM | POA: Diagnosis not present

## 2021-07-17 DIAGNOSIS — E782 Mixed hyperlipidemia: Secondary | ICD-10-CM | POA: Diagnosis not present

## 2021-07-17 DIAGNOSIS — I10 Essential (primary) hypertension: Secondary | ICD-10-CM | POA: Diagnosis not present

## 2021-07-17 DIAGNOSIS — I739 Peripheral vascular disease, unspecified: Secondary | ICD-10-CM | POA: Diagnosis not present

## 2021-07-19 ENCOUNTER — Ambulatory Visit: Payer: Medicare Other | Admitting: Cardiology

## 2021-07-19 ENCOUNTER — Encounter: Payer: Self-pay | Admitting: Cardiology

## 2021-07-19 VITALS — BP 108/62 | HR 63 | Ht 72.0 in | Wt 316.8 lb

## 2021-07-19 DIAGNOSIS — E785 Hyperlipidemia, unspecified: Secondary | ICD-10-CM | POA: Diagnosis not present

## 2021-07-19 DIAGNOSIS — I4821 Permanent atrial fibrillation: Secondary | ICD-10-CM

## 2021-07-19 DIAGNOSIS — I10 Essential (primary) hypertension: Secondary | ICD-10-CM | POA: Diagnosis not present

## 2021-07-19 DIAGNOSIS — E1169 Type 2 diabetes mellitus with other specified complication: Secondary | ICD-10-CM | POA: Diagnosis not present

## 2021-07-19 DIAGNOSIS — I2510 Atherosclerotic heart disease of native coronary artery without angina pectoris: Secondary | ICD-10-CM

## 2021-07-19 DIAGNOSIS — Z951 Presence of aortocoronary bypass graft: Secondary | ICD-10-CM | POA: Diagnosis not present

## 2021-07-19 DIAGNOSIS — I493 Ventricular premature depolarization: Secondary | ICD-10-CM | POA: Diagnosis not present

## 2021-07-19 DIAGNOSIS — Z6841 Body Mass Index (BMI) 40.0 and over, adult: Secondary | ICD-10-CM

## 2021-07-19 MED ORDER — FUROSEMIDE 20 MG PO TABS: 20.0000 mg | ORAL_TABLET | Freq: Every day | ORAL | 1 refills | Status: DC | PRN

## 2021-07-19 NOTE — Progress Notes (Signed)
? ? ? ?Primary Care Provider: Merrilee Seashore, MD ?Cardiologist: Glenetta Hew, MD ?Electrophysiologist: None ? ?Clinic Note: ?Chief Complaint  ?Patient presents with  ? Follow-up  ?  Annual.  Doing well.  No major issues  ? Coronary Artery Disease  ?  No angina  ? ?=================================== ? ?ASSESSMENT/PLAN  ? ?Problem List Items Addressed This Visit   ? ?  ? Cardiology Problems  ? CAD, multiple vessel (Chronic)  ?  11 years out from his CABG and no anginal symptoms since.  Tolerating A-fib. ?Last Myoview was in September 2020 -> would probably recheck in either September 2024 2025 depending on how he is feeling. ? ?Unfortunately, he has sort of fallen off his bandwagon of diet and exercise with continued try to weight loss ever since his retirement.  His weight loss has plateaued, and he is not eating as well.  He also is clearly shown evidence of worsening lipids. ? ?For now continue Zetia and Lipitor at current dose, but will have our clinical pharmacists contact him about different options-we will need to get his old labs. ?He is already on Toprol for rate control and antianginal benefit as well ?On valsartan for afterload reduction. ? ?With mild edema we are adding PRN Lasix. ?  ?  ? Relevant Medications  ? furosemide (LASIX) 20 MG tablet  ? Other Relevant Orders  ? EKG 12-Lead (Completed)  ? Permanent atrial fibrillation ([place]); CHA2DS2-VASc Score = 4-> Eliquis - Primary (Chronic)  ?  Permanent A-fib.  Asymptomatic.  He did not really notice much difference between being in sinus rhythm or not. ? ?Plan: Continue rate control with Toprol 100 mg daily and anticoagulation with Eliquis 5 mg twice daily. ?  ?  ? Relevant Medications  ? furosemide (LASIX) 20 MG tablet  ? Other Relevant Orders  ? EKG 12-Lead (Completed)  ? Hyperlipidemia associated with type 2 diabetes mellitus (HCC) (Chronic)  ?  Most recent lipid panel showed LDL increased.  Also A1c of 7.  Apparently, he just had labs checked by  PCP but not yet available. ? ?He is on high-dose atorvastatin and tolerating okay along with Zetia.  We talked about other medications such as Repatha or Praluent or even inclisiran. ? ?We will need recent labs are from this most recent check.  If still not at goal, I will have our CVRR team contact him to discuss financial concerns about PCSK9 inhibitors or even inclisiran. ? ? ?He needs to follow-up with his PCP.  He is currently only on metformin for diabetes => but this is also known to potentially miss a medication, I think he would probably benefit from GLP-1 agonist.  ->  He thinks he has tried 1 of these in the past but did not tolerate it well.  The newest GLP-1 agonist may be an option.  Could also consider SGLT2 inhibitor. ?  ?  ? Relevant Medications  ? furosemide (LASIX) 20 MG tablet  ? Essential hypertension (Chronic)  ? Relevant Medications  ? furosemide (LASIX) 20 MG tablet  ? Frequent unifocal PVCs (Chronic)  ?  Symptomatically improved with Toprol dosing. ?  ?  ? Relevant Medications  ? furosemide (LASIX) 20 MG tablet  ? Other Relevant Orders  ? EKG 12-Lead (Completed)  ?  ? Other  ? S/P CABG x 4 (Chronic)  ?  Has done well since CABG.  Last Myoview was in 2020-nonischemic.  No active anginal symptoms.  Should be due for follow-up between September 2024  and September 2025. ?  ?  ? Relevant Orders  ? EKG 12-Lead (Completed)  ? Morbid obesity with BMI of 40.0-44.9, adult (HCC) (Chronic)  ?  Unfortunate, in the past year he is backtracked some with weight gain, less exercise and not eating as well.  I am starting to get a sense that he has lost motivation in his retirement mode.  He is enjoying being retired and does not want to make the extra effort. ? ?We talked about dietary adjustments and increasing exercise which he has taken for advisement. ?  ?  ? ?=================================== ? ?HPI:   ? ?Carl Steele is a morbidly obese 71 y.o. male with a PMH notable for CAD-CABG, resolved ICM,  Frequent PVCs, Permanent A-Fib who presents today for annual follow-up. ? ?Carl Steele was last seen on April 26, 2020 -> follow-up monitor and Echo.  Monitor showed persistent A-fib with PVCs and short bursts of NSVT (3-10 beats).  Heart rate range 53-1 77 with an average of 87.  10% PVCs.  Echo showed normal EF 55 to 60% with severe basal LVH and mild concentric LVH.  Mild RV dysfunction moderate MAC. ?=> Potentially completed his move mostly down to [place], and was totally enjoying it.  He and his wife limit retirement community where he is surrounded by ability enjoys hanging out with.  Today they go to the clubhouse and participate in different events.  Being very socially active, and being very physically active.  Completely asymptomatic.  Felt better than he had in years. => Increased level of energy and hopeful weight loss. ? ?Recent Hospitalizations:  ?None ? ?Reviewed  CV studies:   ? ?The following studies were reviewed today: (if available, images/films reviewed: From Epic Chart or Care Everywhere) ?N/a: ? ?Interval History:  ? ?Carl Steele returns for annual follow-up doing well.  He says he is feeling fabulous.  He is loving living down in [place].  He says it takes less than 5 minutes to get from his condo to his first fishing cast.  About the only exercise he does is going up and down the she little steps to get to where he does his fishing.  He stopped going to the fitness center at that retirement community because it was just in poor condition, and somewhat dirty.  He did not feel like preemptive [employer] of Prevagen because it was too expensive. ? ?He also has become a little more sedentary.  He just has the opinion that he is retired and does not want to do anything more than just mild activity.Marland Kitchen  He is happy with this lifestyle.  He does not want to take medication for lipids that would cause so much money that he would take away from his ability to enjoy himself.  This is relatively  understandable. ? ?About the only thing he notes that he has had a little bit of swelling in his ankles at the end of the day => much worse in the community.  But otherwise no PND orthopnea. ? ?While he is in A-fib, he has no sensation whatsoever being in a regular heart rhythm.  No palpitations on no more than usual exertional dyspnea.   ? ?CV Review of Symptoms (Summary):  ?positive for - dyspnea on exertion, edema, and this is mostly because of deconditioning. ?negative for - chest pain, irregular heartbeat, orthopnea, palpitations, paroxysmal nocturnal dyspnea, rapid heart rate, shortness of breath, or lightheadedness, dizziness or weakness, syncope/near syncope or TIA/amaurosis fugax, claudication ? ?REVIEWED  OF SYSTEMS  ? ?Review of Systems  ?Constitutional:  Negative for malaise/fatigue and weight loss (Has still plateaued with weight loss.  Also acknowledges that he has not been doing as well with the exercise and diet).  ?HENT:  Negative for nosebleeds.   ?Respiratory:  Negative for cough and shortness of breath (Only if he overdoes it).   ?Cardiovascular:   ?     Per HPI  ?Gastrointestinal:  Negative for blood in stool and melena.  ?Genitourinary:  Negative for hematuria.  ?Musculoskeletal:  Positive for joint pain (Chronic joint pain in shoulders and knees.). Negative for back pain.  ?Neurological:  Negative for dizziness, focal weakness and headaches.  ?Endo/Heme/Allergies:  Bruises/bleeds easily.  ?Psychiatric/Behavioral: Negative.    ? ?I have reviewed and (if needed) personally updated the patient's problem list, medications, allergies, past medical and surgical history, social and family history.  ? ?PAST MEDICAL HISTORY  ? ?Past Medical History:  ?Diagnosis Date  ? CAD, multiple vessel 06/26/2010  ? 4 atypical chest pressure/dyspnea and frequent PVCs --> no ischemia on Myoview, but significant ectopy --> cardiac cath: 80% proximal LAD, 70% D2, occluded LAD after D2, 70-80% circumflex. Diffuse RCA  and PDA lesions.;; Post-CABG Myoview 2012: No ischemia no infarction. EF 55% with reduced TID  ? Diabetes mellitus without complication ([place])   ? 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 With 10% PVCs as many as 28/min, aver

## 2021-07-19 NOTE — Patient Instructions (Signed)
Medication Instructions:  ? ? May use Lasix ( Furosemide) 20 mg  as needed for swelling   once a day  ? ?*If you need a refill on your cardiac medications before your next appointment, please call your pharmacy* ? ? ?Lab Work: ? ? ?Please have your primary to send copy of labs from 07/17/21 ? ? ?Testing/Procedures: ? ?Not needed ? ?Follow-Up: ?At [facility], you and your health needs are our priority.  As part of our continuing mission to provide you with exceptional heart care, we have created designated Provider Care Teams.  These Care Teams include your primary Cardiologist (physician) and Advanced Practice Providers (APPs -  Physician Assistants and Nurse Practitioners) who all work together to provide you with the care you need, when you need it. ? ?  ? ?Your next appointment:   ?12 month(s) ? ?The format for your next appointment:   ?In Person ? ?Provider:   ?Bryan Lemma, MD  ? ? ? ?

## 2021-07-24 ENCOUNTER — Encounter: Payer: Self-pay | Admitting: Cardiology

## 2021-07-24 DIAGNOSIS — E1165 Type 2 diabetes mellitus with hyperglycemia: Secondary | ICD-10-CM | POA: Diagnosis not present

## 2021-07-24 DIAGNOSIS — I739 Peripheral vascular disease, unspecified: Secondary | ICD-10-CM | POA: Diagnosis not present

## 2021-07-24 DIAGNOSIS — E782 Mixed hyperlipidemia: Secondary | ICD-10-CM | POA: Diagnosis not present

## 2021-07-24 DIAGNOSIS — I4821 Permanent atrial fibrillation: Secondary | ICD-10-CM | POA: Diagnosis not present

## 2021-07-24 DIAGNOSIS — I10 Essential (primary) hypertension: Secondary | ICD-10-CM | POA: Diagnosis not present

## 2021-07-24 DIAGNOSIS — D692 Other nonthrombocytopenic purpura: Secondary | ICD-10-CM | POA: Diagnosis not present

## 2021-07-24 DIAGNOSIS — D6869 Other thrombophilia: Secondary | ICD-10-CM | POA: Diagnosis not present

## 2021-07-24 DIAGNOSIS — I2510 Atherosclerotic heart disease of native coronary artery without angina pectoris: Secondary | ICD-10-CM | POA: Diagnosis not present

## 2021-07-24 NOTE — Assessment & Plan Note (Signed)
Unfortunate, in the past year he is backtracked some with weight gain, less exercise and not eating as well.  I am starting to get a sense that he has lost motivation in his retirement mode.  He is enjoying being retired and does not want to make the extra effort. ? ?We talked about dietary adjustments and increasing exercise which he has taken for advisement. ?

## 2021-07-24 NOTE — Assessment & Plan Note (Signed)
Symptomatically improved with Toprol dosing. ?

## 2021-07-24 NOTE — Assessment & Plan Note (Signed)
Permanent A-fib.  Asymptomatic.  He did not really notice much difference between being in sinus rhythm or not. ? ?Plan: Continue rate control with Toprol 100 mg daily and anticoagulation with Eliquis 5 mg twice daily. ?

## 2021-07-24 NOTE — Assessment & Plan Note (Signed)
Has done well since CABG.  Last Myoview was in 2020-nonischemic.  No active anginal symptoms.  Should be due for follow-up between September 2024 and September 2025. ?

## 2021-07-24 NOTE — Assessment & Plan Note (Signed)
11 years out from his CABG and no anginal symptoms since.  Tolerating A-fib. ?Last Myoview was in September 2020 -> would probably recheck in either September 2024 2025 depending on how he is feeling. ? ?Unfortunately, he has sort of fallen off his bandwagon of diet and exercise with continued try to weight loss ever since his retirement.  His weight loss has plateaued, and he is not eating as well.  He also is clearly shown evidence of worsening lipids. ? ?For now continue Zetia and Lipitor at current dose, but will have our clinical pharmacists contact him about different options-we will need to get his old labs. ?He is already on Toprol for rate control and antianginal benefit as well ?On valsartan for afterload reduction. ? ?With mild edema we are adding PRN Lasix. ?

## 2021-07-24 NOTE — Assessment & Plan Note (Signed)
Most recent lipid panel showed LDL increased.  Also A1c of 7.  Apparently, he just had labs checked by PCP but not yet available. ? ?He is on high-dose atorvastatin and tolerating okay along with Zetia.  We talked about other medications such as Repatha or Praluent or even inclisiran. ? ?We will need recent labs are from this most recent check.  If still not at goal, I will have our CVRR team contact him to discuss financial concerns about PCSK9 inhibitors or even inclisiran. ? ? ?He needs to follow-up with his PCP.  He is currently only on metformin for diabetes => but this is also known to potentially miss a medication, I think he would probably benefit from GLP-1 agonist.  ->  He thinks he has tried 1 of these in the past but did not tolerate it well.  The newest GLP-1 agonist may be an option.  Could also consider SGLT2 inhibitor. ?

## 2021-08-08 ENCOUNTER — Other Ambulatory Visit: Payer: Self-pay | Admitting: Cardiology

## 2021-10-01 ENCOUNTER — Other Ambulatory Visit: Payer: Self-pay | Admitting: Cardiology

## 2021-11-23 ENCOUNTER — Other Ambulatory Visit: Payer: Self-pay | Admitting: Cardiology

## 2021-11-23 NOTE — Telephone Encounter (Signed)
Prescription refill request for Eliquis received. Indication:Afib Last office visit:4/23 Scr:1.1 Age: 71 Weight:143.7 kg  Prescription refilled

## 2021-12-20 DIAGNOSIS — I739 Peripheral vascular disease, unspecified: Secondary | ICD-10-CM | POA: Diagnosis not present

## 2021-12-20 DIAGNOSIS — I4821 Permanent atrial fibrillation: Secondary | ICD-10-CM | POA: Diagnosis not present

## 2021-12-20 DIAGNOSIS — E782 Mixed hyperlipidemia: Secondary | ICD-10-CM | POA: Diagnosis not present

## 2021-12-20 DIAGNOSIS — I2510 Atherosclerotic heart disease of native coronary artery without angina pectoris: Secondary | ICD-10-CM | POA: Diagnosis not present

## 2021-12-20 DIAGNOSIS — D6869 Other thrombophilia: Secondary | ICD-10-CM | POA: Diagnosis not present

## 2021-12-20 DIAGNOSIS — E1165 Type 2 diabetes mellitus with hyperglycemia: Secondary | ICD-10-CM | POA: Diagnosis not present

## 2021-12-20 DIAGNOSIS — D692 Other nonthrombocytopenic purpura: Secondary | ICD-10-CM | POA: Diagnosis not present

## 2021-12-20 DIAGNOSIS — I10 Essential (primary) hypertension: Secondary | ICD-10-CM | POA: Diagnosis not present

## 2021-12-28 DIAGNOSIS — E782 Mixed hyperlipidemia: Secondary | ICD-10-CM | POA: Diagnosis not present

## 2021-12-28 DIAGNOSIS — R600 Localized edema: Secondary | ICD-10-CM | POA: Diagnosis not present

## 2021-12-28 DIAGNOSIS — D692 Other nonthrombocytopenic purpura: Secondary | ICD-10-CM | POA: Diagnosis not present

## 2021-12-28 DIAGNOSIS — I10 Essential (primary) hypertension: Secondary | ICD-10-CM | POA: Diagnosis not present

## 2021-12-28 DIAGNOSIS — Z0000 Encounter for general adult medical examination without abnormal findings: Secondary | ICD-10-CM | POA: Diagnosis not present

## 2021-12-28 DIAGNOSIS — I4821 Permanent atrial fibrillation: Secondary | ICD-10-CM | POA: Diagnosis not present

## 2021-12-28 DIAGNOSIS — I2510 Atherosclerotic heart disease of native coronary artery without angina pectoris: Secondary | ICD-10-CM | POA: Diagnosis not present

## 2021-12-28 DIAGNOSIS — E1165 Type 2 diabetes mellitus with hyperglycemia: Secondary | ICD-10-CM | POA: Diagnosis not present

## 2021-12-28 DIAGNOSIS — I739 Peripheral vascular disease, unspecified: Secondary | ICD-10-CM | POA: Diagnosis not present

## 2022-01-13 DIAGNOSIS — I10 Essential (primary) hypertension: Secondary | ICD-10-CM | POA: Diagnosis not present

## 2022-01-13 DIAGNOSIS — E119 Type 2 diabetes mellitus without complications: Secondary | ICD-10-CM | POA: Diagnosis not present

## 2022-01-13 DIAGNOSIS — S80821A Blister (nonthermal), right lower leg, initial encounter: Secondary | ICD-10-CM | POA: Diagnosis not present

## 2022-01-13 DIAGNOSIS — X58XXXA Exposure to other specified factors, initial encounter: Secondary | ICD-10-CM | POA: Diagnosis not present

## 2022-01-22 DIAGNOSIS — S80821A Blister (nonthermal), right lower leg, initial encounter: Secondary | ICD-10-CM | POA: Diagnosis not present

## 2022-01-22 DIAGNOSIS — B351 Tinea unguium: Secondary | ICD-10-CM | POA: Diagnosis not present

## 2022-01-22 DIAGNOSIS — L259 Unspecified contact dermatitis, unspecified cause: Secondary | ICD-10-CM | POA: Diagnosis not present

## 2022-01-22 DIAGNOSIS — I739 Peripheral vascular disease, unspecified: Secondary | ICD-10-CM | POA: Diagnosis not present

## 2022-01-22 DIAGNOSIS — E1140 Type 2 diabetes mellitus with diabetic neuropathy, unspecified: Secondary | ICD-10-CM | POA: Diagnosis not present

## 2022-01-25 DIAGNOSIS — I70223 Atherosclerosis of native arteries of extremities with rest pain, bilateral legs: Secondary | ICD-10-CM | POA: Diagnosis not present

## 2022-02-01 DIAGNOSIS — I890 Lymphedema, not elsewhere classified: Secondary | ICD-10-CM | POA: Diagnosis not present

## 2022-02-01 DIAGNOSIS — I70223 Atherosclerosis of native arteries of extremities with rest pain, bilateral legs: Secondary | ICD-10-CM | POA: Diagnosis not present

## 2022-02-01 DIAGNOSIS — I70232 Atherosclerosis of native arteries of right leg with ulceration of calf: Secondary | ICD-10-CM | POA: Diagnosis not present

## 2022-02-05 DIAGNOSIS — I739 Peripheral vascular disease, unspecified: Secondary | ICD-10-CM | POA: Diagnosis not present

## 2022-02-05 DIAGNOSIS — S80821A Blister (nonthermal), right lower leg, initial encounter: Secondary | ICD-10-CM | POA: Diagnosis not present

## 2022-02-05 DIAGNOSIS — S80821D Blister (nonthermal), right lower leg, subsequent encounter: Secondary | ICD-10-CM | POA: Diagnosis not present

## 2022-02-05 DIAGNOSIS — B351 Tinea unguium: Secondary | ICD-10-CM | POA: Diagnosis not present

## 2022-02-05 DIAGNOSIS — E1140 Type 2 diabetes mellitus with diabetic neuropathy, unspecified: Secondary | ICD-10-CM | POA: Diagnosis not present

## 2022-02-05 DIAGNOSIS — L259 Unspecified contact dermatitis, unspecified cause: Secondary | ICD-10-CM | POA: Diagnosis not present

## 2022-02-09 DIAGNOSIS — Z01812 Encounter for preprocedural laboratory examination: Secondary | ICD-10-CM | POA: Diagnosis not present

## 2022-02-09 DIAGNOSIS — I70223 Atherosclerosis of native arteries of extremities with rest pain, bilateral legs: Secondary | ICD-10-CM | POA: Diagnosis not present

## 2022-02-09 DIAGNOSIS — M79606 Pain in leg, unspecified: Secondary | ICD-10-CM | POA: Diagnosis not present

## 2022-02-22 DIAGNOSIS — I70232 Atherosclerosis of native arteries of right leg with ulceration of calf: Secondary | ICD-10-CM | POA: Diagnosis not present

## 2022-02-22 DIAGNOSIS — I70223 Atherosclerosis of native arteries of extremities with rest pain, bilateral legs: Secondary | ICD-10-CM | POA: Diagnosis not present

## 2022-02-22 DIAGNOSIS — I70221 Atherosclerosis of native arteries of extremities with rest pain, right leg: Secondary | ICD-10-CM | POA: Diagnosis not present

## 2022-03-01 DIAGNOSIS — I70223 Atherosclerosis of native arteries of extremities with rest pain, bilateral legs: Secondary | ICD-10-CM | POA: Diagnosis not present

## 2022-03-19 DIAGNOSIS — I70232 Atherosclerosis of native arteries of right leg with ulceration of calf: Secondary | ICD-10-CM | POA: Diagnosis not present

## 2022-03-19 DIAGNOSIS — I70223 Atherosclerosis of native arteries of extremities with rest pain, bilateral legs: Secondary | ICD-10-CM | POA: Diagnosis not present

## 2022-03-19 DIAGNOSIS — I890 Lymphedema, not elsewhere classified: Secondary | ICD-10-CM | POA: Diagnosis not present

## 2022-05-14 ENCOUNTER — Other Ambulatory Visit: Payer: Self-pay | Admitting: Cardiology

## 2022-07-24 DIAGNOSIS — D692 Other nonthrombocytopenic purpura: Secondary | ICD-10-CM | POA: Diagnosis not present

## 2022-07-24 DIAGNOSIS — R600 Localized edema: Secondary | ICD-10-CM | POA: Diagnosis not present

## 2022-07-24 DIAGNOSIS — I739 Peripheral vascular disease, unspecified: Secondary | ICD-10-CM | POA: Diagnosis not present

## 2022-07-24 DIAGNOSIS — I2510 Atherosclerotic heart disease of native coronary artery without angina pectoris: Secondary | ICD-10-CM | POA: Diagnosis not present

## 2022-07-24 DIAGNOSIS — E1165 Type 2 diabetes mellitus with hyperglycemia: Secondary | ICD-10-CM | POA: Diagnosis not present

## 2022-07-24 DIAGNOSIS — I10 Essential (primary) hypertension: Secondary | ICD-10-CM | POA: Diagnosis not present

## 2022-07-24 DIAGNOSIS — E782 Mixed hyperlipidemia: Secondary | ICD-10-CM | POA: Diagnosis not present

## 2022-07-24 DIAGNOSIS — I4821 Permanent atrial fibrillation: Secondary | ICD-10-CM | POA: Diagnosis not present

## 2022-07-24 LAB — LAB REPORT - SCANNED
A1c: 7.1
Creatinine, POC: 216.7 mg/dL
Microalb Creat Ratio: 24
Microalbumin, Urine: 5.3

## 2022-07-31 DIAGNOSIS — R600 Localized edema: Secondary | ICD-10-CM | POA: Diagnosis not present

## 2022-07-31 DIAGNOSIS — E782 Mixed hyperlipidemia: Secondary | ICD-10-CM | POA: Diagnosis not present

## 2022-07-31 DIAGNOSIS — I4821 Permanent atrial fibrillation: Secondary | ICD-10-CM | POA: Diagnosis not present

## 2022-07-31 DIAGNOSIS — I10 Essential (primary) hypertension: Secondary | ICD-10-CM | POA: Diagnosis not present

## 2022-07-31 DIAGNOSIS — E1165 Type 2 diabetes mellitus with hyperglycemia: Secondary | ICD-10-CM | POA: Diagnosis not present

## 2022-07-31 DIAGNOSIS — I739 Peripheral vascular disease, unspecified: Secondary | ICD-10-CM | POA: Diagnosis not present

## 2022-07-31 DIAGNOSIS — D692 Other nonthrombocytopenic purpura: Secondary | ICD-10-CM | POA: Diagnosis not present

## 2022-07-31 DIAGNOSIS — I2510 Atherosclerotic heart disease of native coronary artery without angina pectoris: Secondary | ICD-10-CM | POA: Diagnosis not present

## 2022-07-31 DIAGNOSIS — N182 Chronic kidney disease, stage 2 (mild): Secondary | ICD-10-CM | POA: Diagnosis not present

## 2022-09-17 DIAGNOSIS — I70223 Atherosclerosis of native arteries of extremities with rest pain, bilateral legs: Secondary | ICD-10-CM | POA: Diagnosis not present

## 2022-09-18 DIAGNOSIS — I70232 Atherosclerosis of native arteries of right leg with ulceration of calf: Secondary | ICD-10-CM | POA: Diagnosis not present

## 2022-09-18 DIAGNOSIS — I890 Lymphedema, not elsewhere classified: Secondary | ICD-10-CM | POA: Diagnosis not present

## 2022-09-18 DIAGNOSIS — I70223 Atherosclerosis of native arteries of extremities with rest pain, bilateral legs: Secondary | ICD-10-CM | POA: Diagnosis not present

## 2022-10-11 ENCOUNTER — Other Ambulatory Visit: Payer: Self-pay | Admitting: Cardiology

## 2022-10-11 DIAGNOSIS — I4821 Permanent atrial fibrillation: Secondary | ICD-10-CM

## 2022-10-12 ENCOUNTER — Other Ambulatory Visit: Payer: Self-pay

## 2022-10-12 DIAGNOSIS — I4821 Permanent atrial fibrillation: Secondary | ICD-10-CM

## 2022-10-12 NOTE — Telephone Encounter (Signed)
Prescription refill request for Eliquis received. Indication:afib Last office visit:needs appt VHQ:IONGE labs Age: 72 Weight:143.7  kg  Prescription refilled

## 2022-10-28 ENCOUNTER — Other Ambulatory Visit: Payer: Self-pay | Admitting: Cardiology

## 2022-10-29 NOTE — Telephone Encounter (Signed)
Prescription refill request for Eliquis received. Indication:afib Last office visit:needs appt Scr:1.09  4/24 Age: 72 Weight:143.7  kg  Prescription refilled

## 2022-11-03 NOTE — Progress Notes (Signed)
Labs from 07/24/2022 (PCP Na+ 141, K+ 4.6, Cl- 106, HCO3-24, BUN 14, Cr 1.09, Glu 124, Ca2+ 8.9; AST 18, ALT 28, AlkP 104; TC 126, TG 93, HDL 37, LDL 78; A1c 7.1 (up from 6.9); TSH 1.33.  Labs reviewed.  Other than blood sugars and A1c being up a little bit, looks pretty good.  Bryan Lemma, MD

## 2022-11-14 ENCOUNTER — Encounter: Payer: Self-pay | Admitting: *Deleted

## 2022-11-25 ENCOUNTER — Other Ambulatory Visit: Payer: Self-pay | Admitting: Cardiology

## 2022-11-26 NOTE — Telephone Encounter (Signed)
Prescription refill request for Eliquis received. Indication:afib Last office visit:needs appt Scr:1.09  4/24 Age: 72 Weight:143.7  kg  Prescription refilled

## 2022-12-16 ENCOUNTER — Other Ambulatory Visit: Payer: Self-pay | Admitting: Cardiology

## 2022-12-18 NOTE — Telephone Encounter (Signed)
Prescription refill request for Eliquis received. Indication:afib Last office visit:needs appt Scr:1.09  4/24 Age: 72 Weight:143.7  kg  Prescription refilled

## 2022-12-27 DIAGNOSIS — I739 Peripheral vascular disease, unspecified: Secondary | ICD-10-CM | POA: Diagnosis not present

## 2022-12-27 DIAGNOSIS — I4821 Permanent atrial fibrillation: Secondary | ICD-10-CM | POA: Diagnosis not present

## 2022-12-27 DIAGNOSIS — D692 Other nonthrombocytopenic purpura: Secondary | ICD-10-CM | POA: Diagnosis not present

## 2022-12-27 DIAGNOSIS — E782 Mixed hyperlipidemia: Secondary | ICD-10-CM | POA: Diagnosis not present

## 2022-12-27 DIAGNOSIS — E1165 Type 2 diabetes mellitus with hyperglycemia: Secondary | ICD-10-CM | POA: Diagnosis not present

## 2022-12-28 LAB — LAB REPORT - SCANNED
A1c: 6.7
Albumin, Urine POC: 35.4
Creatinine, POC: 182.2 mg/dL
EGFR: 73
Microalb Creat Ratio: 19

## 2023-01-02 ENCOUNTER — Encounter: Payer: Self-pay | Admitting: Cardiology

## 2023-01-02 ENCOUNTER — Ambulatory Visit: Payer: Medicare Other | Attending: Cardiology | Admitting: Cardiology

## 2023-01-02 VITALS — BP 122/78 | HR 68 | Ht 72.0 in | Wt 308.2 lb

## 2023-01-02 DIAGNOSIS — I493 Ventricular premature depolarization: Secondary | ICD-10-CM

## 2023-01-02 DIAGNOSIS — E785 Hyperlipidemia, unspecified: Secondary | ICD-10-CM | POA: Diagnosis not present

## 2023-01-02 DIAGNOSIS — E1169 Type 2 diabetes mellitus with other specified complication: Secondary | ICD-10-CM

## 2023-01-02 DIAGNOSIS — I739 Peripheral vascular disease, unspecified: Secondary | ICD-10-CM | POA: Diagnosis not present

## 2023-01-02 DIAGNOSIS — I4821 Permanent atrial fibrillation: Secondary | ICD-10-CM | POA: Diagnosis not present

## 2023-01-02 DIAGNOSIS — Z6841 Body Mass Index (BMI) 40.0 and over, adult: Secondary | ICD-10-CM

## 2023-01-02 DIAGNOSIS — I2510 Atherosclerotic heart disease of native coronary artery without angina pectoris: Secondary | ICD-10-CM

## 2023-01-02 DIAGNOSIS — Z951 Presence of aortocoronary bypass graft: Secondary | ICD-10-CM

## 2023-01-02 DIAGNOSIS — I10 Essential (primary) hypertension: Secondary | ICD-10-CM

## 2023-01-02 NOTE — Progress Notes (Signed)
Cardiology Office Note:  .   Date:  01/06/2023  ID:  Carl Steele, DOB [birth year], MRN [id number] PCP: Georgianne Fick, MD  [facility] Providers Cardiologist:  Bryan Lemma, MD     Chief Complaint  Patient presents with   Follow-up    Practically 54-month follow-up-was not able to get in until 6 months out   Coronary Artery Disease    No angina.  Discussed diagnosis of PAD   Atrial Fibrillation    Not really symptomatic of A-fib.  No bleeding on Eliquis.    History of Present Illness: .     Carl Steele is a morbidly obese 72 y.o. male with a PMH notable for CAD-CABG, frequent PVCs, permanent A-fib, HTN and HLD as well as DM-2 who presents here for delayed annual follow-up at the request of Georgianne Fick, MD.  Carl Steele was last seen on 07/19/2021 for an annual follow-up and is doing very well.  He was thoroughly enjoying living in [place].  Easily an ambulance get to his fishing.  But not really getting much exercise.  Going up down the steps to the fishing site was what he would do.  Had gotten out of the habit of going to the fitness center at the retirement community because he was being redone.  Had become more sedentary and gained some weight back.  Did not want to take any medications for lipids at that time worried about cost.  Mild end of day swelling.  Not really all that sensitive and being in A-fib.  No heart failure or angina symptoms.     Subjective   INTERVAL HISTORY Carl Steele returns here today again back to doing fairly well on the actually has lost some weight having adjusted his diet cutting down on some foods and try to be more active.  He got very concerned because last year in September he had a blister on his right leg that started getting infected and was thought to be related to diabetes.  He was seeing a podiatrist and the wound was being cared for but he was referred to vascular surgeon because of decreased pedal pulses.   They checked ABIs and showed significant bilateral lower extremity disease and he underwent lower extremity imaging followed by angiography and atherectomy with stent to the left leg and PTA of the right leg.  He also then noted that his claudication symptoms have improved.  He had not really been complaining of claudication because he was not walking very much.  Ever since then he tried to get more serious with his diet and try to get his conditions under control.  He started the right EXTR ice along with his statin and Zetia.  He was started back on metformin.  Thankfully, despite all of these issues, he really has not had any cardiac issues.  He feels his heart rate beating funny every now and then it does feel some skipped beats off and on but no sensation of rapid A-fib.  No resting or exertional chest pain or pressure.  No PND, orthopnea or edema.  He is deconditioned so he will get short of breath when he does more than routine level activity but is trying to get back and exercise now.  No melena, hematochezia hematuria epistaxis at this point but has had some nosebleeds off and on. No syncope or near-syncope, TIA or amaurosis fugax.  Noted improved symptoms that were probably claudication.  Diabetic ulcer seem to be healing.  ROS:  Review  of Systems - Negative except symptoms noted above.  Lots of issues related to him having been deconditioned and getting out of the habit of healthy eating and exercise.  No further diabetic wounds or ulcers.  Claudication improved.     Objective   Studies Reviewed: Marland Kitchen   EKG Interpretation Date/Time:  Wednesday January 02 2023 15:04:09 EDT Ventricular Rate:  67 PR Interval:    QRS Duration:  76 QT Interval:  398 QTC Calculation: 420 R Axis:   0  Text Interpretation: Atrial fibrillation with premature ventricular or aberrantly conducted complexes Low voltage QRS Cannot rule out Inferior infarct , age undetermined Since  Last scanned EKG - No  significant change since Confirmed by Bryan Lemma ([id number]) on 01/02/2023 3:43:53 PM    Past CV Procedure History:  Procedure  Date   CARDIAC CATHETERIZATION  March 2012   Initial Myoview - TID 1.5, frequent PVCs (in pairs & triplets), no ischemia (likely balanced ischemia) --> CATH: 80% PROX LAD, 70% D2, AND AN OCCLUDED LAD AFTER D2 , 70 -80% CIRC WITH SMALL OM2 AND DIFFUSE PROX LESIONS RCA AND PDA   CORONARY ARTERY BYPASS GRAFT  07/07/2010   LIMA-LAD,SVG-D2,SVG-DISTAL CIRC, SVG-PDA-POST OP COMPLICATED BY AFIB BUT NO RECURRENCE; endovascular harvest of vein from right thigh; open harvest from left lower leg.   NM MYOCAR PERF WALL MOTION  09/22/2010   EF55%, TID was down t0 0.95 from 1.5 and no further arrhythmia    NM MYOVIEW LTD  01/08/2019   LOW RISK. No ischemia or infarction. EF roughly 50%.   TRANSTHORACIC ECHOCARDIOGRAM  JUNE 2012   MILD DIASTOLIC DYSFUNCTION WITH NORMAL  EF > 55% ,MILD LEFT ATRIAL AND RIGHT ATRIAL DILATION ,MILD TO MODERATRE MITARL REGURG AND AORTIC  VALVE SCLEROSIS   PAD procedure with left SFA atherectomy and stent and likely right lower limb PTA (unaware of details.)  Labs from 07/24/2022 (PCP Na+ 141, K+ 4.6, Cl- 106, HCO3-24, BUN 20, Cr 1.08, Glu 103, Ca2+ 9.5; AST 18, ALT 20, AlkP 95; TC 123, TG 82, HDL 41, LDL 66; A1c 6.7 ;  W7.8, H/H14.3/44.7, PLT 227 .  Risk Assessment/Calculations:    CHA2DS2-VASc Score = 5   This indicates a 7.2% annual risk of stroke. The patient's score is based upon: CHF History: 1 HTN History: 1 Diabetes History: 1 Stroke History: 0 Vascular Disease History: 1 Age Score: 1 Gender Score: 0     Physical Exam:   VS:  BP 122/78   Pulse 68   Ht 6' (1.829 m)   Wt (!) 308 lb 3.2 oz (139.8 kg)   SpO2 98%   BMI 41.80 kg/m    Wt Readings from Last 3 Encounters:  01/02/23 (!) 308 lb 3.2 oz (139.8 kg)  07/19/21 (!) 316 lb 12.8 oz (143.7 kg)  12/16/20 (!) 304 lb (137.9 kg)    GEN: Well nourished, well developed in no acute  distress; remains morbidly obese but does not look thinner.  Well-groomed.  Actually seems healthy NECK: No JVD; No carotid bruits CARDIAC: Distant heart sounds, but normal S1, S2; irregularly irregular rhythm with normal rate.,  High-pitched 1/6 HSM at apex. RESPIRATORY:  Clear to auscultation without rales, wheezing or rhonchi ; nonlabored, good air movement. ABDOMEN: Soft, non-tender, non-distended EXTREMITIES: Trace bilateral ankle edema; No deformity ; there is a healed 3 inch diameter wound (previous blister) on the right lower leg.     ASSESSMENT AND PLAN: .    Problem List Items Addressed This Visit  Cardiology Problems   CAD, multiple vessel - Primary (Chronic)    12-1/2 years out from CABG with most recent Myoview in 2020 nonischemic. Continues to do well from a symptom standpoint, but is not all that active.  He does have more notable PVCs on EKG, and has had progression of his PAD.  I encouraged him to get back on the bandwagon of diet and exercise he did do a good job having lost 8 pounds.  His LDL is borderline now at 78.  He said that by next year he would be interested in talking about new treatment options for lipids but needs to get through this year as far as rearranging his Medicare pharmaceutical prescription drug plan.  Plan: Not on aspirin or Plavix because of DOAC. Continue combination of Zetia 10 mg atorvastatin 80 mg. Continue current doses of Toprol and Diovan his blood pressure is well-controlled.      Relevant Orders   EKG 12-Lead (Completed)   MYOCARDIAL PERFUSION IMAGING   Cardiac Stress Test: Informed Consent Details: Physician/Practitioner Attestation; Transcribe to consent form and obtain patient signature   Essential hypertension (Chronic)   Relevant Orders   EKG 12-Lead (Completed)   Frequent unifocal PVCs (Chronic)    We bumped his metoprolol to 100 mg in the past and that seemed to be helping but he still has some on exam.  This was really  the elimination symptoms when we diagnosis multivessel disease back in 2011.  I would like to go ahead and proceed with ischemic evaluation-Myoview now, but he would like to wait until 2025.  I agree that this is okay based on the fact that he has no ongoing symptoms, will schedule him for Myoview prior to his annual follow-up.      Relevant Orders   EKG 12-Lead (Completed)   MYOCARDIAL PERFUSION IMAGING   Cardiac Stress Test: Informed Consent Details: Physician/Practitioner Attestation; Transcribe to consent form and obtain patient signature   Hyperlipidemia associated with type 2 diabetes mellitus (HCC) (Chronic)    He actually was due to have labs checked by PCP.  We reviewed his labs from April and his LDL was not at goal.  However follow-up labs showed LDL now down to 66 on the combination of rosuvastatin and Zetia.  Would like to see if staying in this range.  May need to consider more aggressive management and follow-up if he is not meeting goal.  Could simply consider Nexlizet as opposed to just Zetia.  As for diabetes, A1c was back down from 7.1 to 6.7.  This probably correlates with his adjustment in diet and weight loss.  Hopefully with continued diet and exercise which he can continue to improve his lipids and sugar levels.  I still think he would probably benefit from GLP-1 agonist.      Permanent atrial fibrillation (HCC); CHA2DS2-VASc Score = 4-> Eliquis (Chronic)   Relevant Orders   EKG 12-Lead (Completed)   MYOCARDIAL PERFUSION IMAGING   Cardiac Stress Test: Informed Consent Details: Physician/Practitioner Attestation; Transcribe to consent form and obtain patient signature     Other   Intermittent claudication (HCC) (Chronic)    Newly diagnosed PAD he had been on cilostazol but no longer.  He is now being followed by vascular surgeon down in [place] and had bilateral leg interventions.  Now claudication seems to be well stabilized.      Morbid obesity with BMI of 40.0-44.9,  adult (HCC) (Chronic)    I congratulated him on his efforts.  I do think that with CAD and use we have to indications to use GLP-1 agonists but he is again reluctant to try new medicines.  I think he would probably benefit from Ozempic/Mounjaro but also from [facility] based on obesity and heart disease.  Would be hard to get this started with him living in [place].  He wants to wait for any medications until he adjusts his Medicare plan so we will discuss at follow-up      S/P CABG x 4 (Chronic)    Due for follow-up Myoview stress test.  He would like to wait till 2025 we will try to have this scheduled prior to his annual follow-up with me.  Thankfully,, no symptoms.       ADDENDUM Labs from 01/03/2023 (PCP Na+ 137, K+ 5.1, Cl- 100, HCO3-20, BUN 14, Cr 1.09, Glu 124, Ca2+ 8.9; AST 18, ALT 28, AlkP 104; TC 126, TG 93, HDL 37, LDL 78; A1c 7.1 (up from 6.9); TSH 1.33.      Informed Consent   Shared Decision Making/Informed Consent The risks [chest pain, shortness of breath, cardiac arrhythmias, dizziness, blood pressure fluctuations, myocardial infarction, stroke/transient ischemic attack, nausea, vomiting, allergic reaction, radiation exposure, metallic taste sensation and life-threatening complications (estimated to be 1 in 10,000)], benefits (risk stratification, diagnosing coronary artery disease, treatment guidance) and alternatives of a nuclear stress test were discussed in detail with Carl Steele and he agrees to proceed.      Dispo: Return in about 1 year (around 01/02/2024) for 1 Yr Follow-up, Follow-up in [facility] office.  Total time spent: 24 min spent with patient + 14 min spent charting = 38 min     Signed, Marykay Lex, MD, MS Bryan Lemma, M.D., M.S. Interventional Cardiologist  [facility]  Pager # [phone] Phone # [phone] [address] [place], [place] [address]

## 2023-01-02 NOTE — Patient Instructions (Signed)
Medication Instructions:  No changes   *If you need a refill on your cardiac medications before your next appointment, please call your pharmacy*   Lab Work:  Not needed   Testing/Procedures: Will be schedule in Aug- Sept  2025  prior to visit  Sept 2025  Your doctor has scheduled you for a Myocardial Perfusion scan  to obtain information about the blood flow to your heart. The test consists of taking pictures of your heart in two phases: while resting and after a stress test.  The stress test part- you will be given a drug intended to have a similar effect on the heart to that of exercise.  The test will take approximately 3 to 4  hours to complete.  How to prepare for your test: Do not eat or drink 2 hours prior to your test Do not consume products containing caffeine 12 hours prior to your test (examples: coffee (regular OR decaf), chocolate, sodas, tea) Your doctor may need you to hold certain medications prior to the test.  If so, these are listed below and should not be taken for 24 hours prior to the test.  If not listed below, you may take your medications as normal.  You may resume taking held medications on your normal schedule once the test is complete.   Meds to hold: none Do bring a list of your current medications with you.  If you have held any meds in preparation for the test, please bring them, as you may be required to take them once the test is completed. Do wear comfortable clothes and walking shoes.  Do not wear dresses or overalls. Do NOT wear cologne, perfume, aftershave, or fragranced lotions the day of your test (deodorants okay). If these instructions are not followed your test will have to be rescheduled.   A nuclear cardiologist will review your test, prepare a report and send it to your physician.   If you have questions or concerns about your appointment, you can call the Nuclear Cardiology department at [phone]. If you cannot keep your appointment,  please provide 48 hours notification to avoid a possible $50.00 charge to your account.   Please arrive 15 minutes prior to your appointment time for registration and insurance purposes    Follow-Up: At [facility], you and your health needs are our priority.  As part of our continuing mission to provide you with exceptional heart care, we have created designated Provider Care Teams.  These Care Teams include your primary Cardiologist (physician) and Advanced Practice Providers (APPs -  Physician Assistants and Nurse Practitioners) who all work together to provide you with the care you need, when you need it.     Your next appointment:   12 month(s)  The format for your next appointment:   In Person  Provider:   Bryan Lemma, MD    Other Instructions

## 2023-01-03 DIAGNOSIS — N182 Chronic kidney disease, stage 2 (mild): Secondary | ICD-10-CM | POA: Diagnosis not present

## 2023-01-03 DIAGNOSIS — I2510 Atherosclerotic heart disease of native coronary artery without angina pectoris: Secondary | ICD-10-CM | POA: Diagnosis not present

## 2023-01-03 DIAGNOSIS — I10 Essential (primary) hypertension: Secondary | ICD-10-CM | POA: Diagnosis not present

## 2023-01-03 DIAGNOSIS — Z0000 Encounter for general adult medical examination without abnormal findings: Secondary | ICD-10-CM | POA: Diagnosis not present

## 2023-01-03 DIAGNOSIS — D692 Other nonthrombocytopenic purpura: Secondary | ICD-10-CM | POA: Diagnosis not present

## 2023-01-03 DIAGNOSIS — E782 Mixed hyperlipidemia: Secondary | ICD-10-CM | POA: Diagnosis not present

## 2023-01-03 DIAGNOSIS — R600 Localized edema: Secondary | ICD-10-CM | POA: Diagnosis not present

## 2023-01-05 ENCOUNTER — Other Ambulatory Visit: Payer: Self-pay | Admitting: Cardiology

## 2023-01-06 ENCOUNTER — Encounter: Payer: Self-pay | Admitting: Cardiology

## 2023-01-06 NOTE — Assessment & Plan Note (Addendum)
He actually was due to have labs checked by PCP.  We reviewed his labs from April and his LDL was not at goal.  However follow-up labs showed LDL now down to 66 on the combination of rosuvastatin and Zetia.  Would like to see if staying in this range.  May need to consider more aggressive management and follow-up if he is not meeting goal.  Could simply consider Nexlizet as opposed to just Zetia.  As for diabetes, A1c was back down from 7.1 to 6.7.  This probably correlates with his adjustment in diet and weight loss.  Hopefully with continued diet and exercise which he can continue to improve his lipids and sugar levels.  I still think he would probably benefit from GLP-1 agonist.

## 2023-01-06 NOTE — Assessment & Plan Note (Signed)
I congratulated him on his efforts.  I do think that with CAD and use we have to indications to use GLP-1 agonists but he is again reluctant to try new medicines.  I think he would probably benefit from Ozempic/Mounjaro but also from [facility] based on obesity and heart disease.  Would be hard to get this started with him living in [place].  He wants to wait for any medications until he adjusts his Medicare plan so we will discuss at follow-up

## 2023-01-06 NOTE — Assessment & Plan Note (Signed)
12-1/2 years out from CABG with most recent Myoview in 2020 nonischemic. Continues to do well from a symptom standpoint, but is not all that active.  He does have more notable PVCs on EKG, and has had progression of his PAD.  I encouraged him to get back on the bandwagon of diet and exercise he did do a good job having lost 8 pounds.  His LDL is borderline now at 78.  He said that by next year he would be interested in talking about new treatment options for lipids but needs to get through this year as far as rearranging his Medicare pharmaceutical prescription drug plan.  Plan: Not on aspirin or Plavix because of DOAC. Continue combination of Zetia 10 mg atorvastatin 80 mg. Continue current doses of Toprol and Diovan his blood pressure is well-controlled.

## 2023-01-06 NOTE — Assessment & Plan Note (Signed)
Newly diagnosed PAD he had been on cilostazol but no longer.  He is now being followed by vascular surgeon down in [place] and had bilateral leg interventions.  Now claudication seems to be well stabilized.

## 2023-01-06 NOTE — Assessment & Plan Note (Signed)
We bumped his metoprolol to 100 mg in the past and that seemed to be helping but he still has some on exam.  This was really the elimination symptoms when we diagnosis multivessel disease back in 2011.  I would like to go ahead and proceed with ischemic evaluation-Myoview now, but he would like to wait until 2025.  I agree that this is okay based on the fact that he has no ongoing symptoms, will schedule him for Myoview prior to his annual follow-up.

## 2023-01-06 NOTE — Assessment & Plan Note (Addendum)
Due for follow-up Myoview stress test.  He would like to wait till 2025 we will try to have this scheduled prior to his annual follow-up with me.  Thankfully,, no symptoms.

## 2023-01-07 NOTE — Telephone Encounter (Signed)
Prescription refill request for Eliquis received. Indication:afib Last office visit:9/24 Scr:1.09  4/24 Age: 72 Weight:139.8  kg  Prescription refilled

## 2023-02-05 ENCOUNTER — Other Ambulatory Visit: Payer: Self-pay | Admitting: Cardiology

## 2023-08-01 DIAGNOSIS — N182 Chronic kidney disease, stage 2 (mild): Secondary | ICD-10-CM | POA: Diagnosis not present

## 2023-08-01 DIAGNOSIS — E1165 Type 2 diabetes mellitus with hyperglycemia: Secondary | ICD-10-CM | POA: Diagnosis not present

## 2023-08-01 DIAGNOSIS — I739 Peripheral vascular disease, unspecified: Secondary | ICD-10-CM | POA: Diagnosis not present

## 2023-08-01 DIAGNOSIS — I4821 Permanent atrial fibrillation: Secondary | ICD-10-CM | POA: Diagnosis not present

## 2023-08-02 LAB — LAB REPORT - SCANNED
A1c: 7.1
Albumin, Urine POC: 35.4
Albumin/Creatinine Ratio, Urine, POC: 19
Creatinine, POC: 182.2 mg/dL
EGFR (Non-African Amer.): 68
EGFR: 74

## 2023-08-08 DIAGNOSIS — E1165 Type 2 diabetes mellitus with hyperglycemia: Secondary | ICD-10-CM | POA: Diagnosis not present

## 2023-08-08 DIAGNOSIS — R600 Localized edema: Secondary | ICD-10-CM | POA: Diagnosis not present

## 2023-08-08 DIAGNOSIS — I2510 Atherosclerotic heart disease of native coronary artery without angina pectoris: Secondary | ICD-10-CM | POA: Diagnosis not present

## 2023-08-08 DIAGNOSIS — I4821 Permanent atrial fibrillation: Secondary | ICD-10-CM | POA: Diagnosis not present

## 2023-08-08 DIAGNOSIS — E782 Mixed hyperlipidemia: Secondary | ICD-10-CM | POA: Diagnosis not present

## 2023-08-08 DIAGNOSIS — D692 Other nonthrombocytopenic purpura: Secondary | ICD-10-CM | POA: Diagnosis not present

## 2023-08-08 DIAGNOSIS — N182 Chronic kidney disease, stage 2 (mild): Secondary | ICD-10-CM | POA: Diagnosis not present

## 2023-08-08 DIAGNOSIS — I739 Peripheral vascular disease, unspecified: Secondary | ICD-10-CM | POA: Diagnosis not present

## 2023-08-08 DIAGNOSIS — I10 Essential (primary) hypertension: Secondary | ICD-10-CM | POA: Diagnosis not present

## 2023-10-11 ENCOUNTER — Other Ambulatory Visit: Payer: Self-pay | Admitting: Cardiology

## 2023-11-13 DIAGNOSIS — S80821D Blister (nonthermal), right lower leg, subsequent encounter: Secondary | ICD-10-CM | POA: Diagnosis not present

## 2023-11-13 DIAGNOSIS — B351 Tinea unguium: Secondary | ICD-10-CM | POA: Diagnosis not present

## 2023-11-13 DIAGNOSIS — L089 Local infection of the skin and subcutaneous tissue, unspecified: Secondary | ICD-10-CM | POA: Diagnosis not present

## 2023-11-13 DIAGNOSIS — I739 Peripheral vascular disease, unspecified: Secondary | ICD-10-CM | POA: Diagnosis not present

## 2023-11-13 DIAGNOSIS — L259 Unspecified contact dermatitis, unspecified cause: Secondary | ICD-10-CM | POA: Diagnosis not present

## 2023-11-13 DIAGNOSIS — L97911 Non-pressure chronic ulcer of unspecified part of right lower leg limited to breakdown of skin: Secondary | ICD-10-CM | POA: Diagnosis not present

## 2023-11-13 DIAGNOSIS — E1140 Type 2 diabetes mellitus with diabetic neuropathy, unspecified: Secondary | ICD-10-CM | POA: Diagnosis not present

## 2023-11-13 DIAGNOSIS — S80821A Blister (nonthermal), right lower leg, initial encounter: Secondary | ICD-10-CM | POA: Diagnosis not present

## 2023-11-20 DIAGNOSIS — S80821D Blister (nonthermal), right lower leg, subsequent encounter: Secondary | ICD-10-CM | POA: Diagnosis not present

## 2023-11-20 DIAGNOSIS — E1140 Type 2 diabetes mellitus with diabetic neuropathy, unspecified: Secondary | ICD-10-CM | POA: Diagnosis not present

## 2023-11-20 DIAGNOSIS — L089 Local infection of the skin and subcutaneous tissue, unspecified: Secondary | ICD-10-CM | POA: Diagnosis not present

## 2023-11-20 DIAGNOSIS — B351 Tinea unguium: Secondary | ICD-10-CM | POA: Diagnosis not present

## 2023-11-20 DIAGNOSIS — L259 Unspecified contact dermatitis, unspecified cause: Secondary | ICD-10-CM | POA: Diagnosis not present

## 2023-11-20 DIAGNOSIS — L97911 Non-pressure chronic ulcer of unspecified part of right lower leg limited to breakdown of skin: Secondary | ICD-10-CM | POA: Diagnosis not present

## 2023-11-20 DIAGNOSIS — S80821A Blister (nonthermal), right lower leg, initial encounter: Secondary | ICD-10-CM | POA: Diagnosis not present

## 2023-11-20 DIAGNOSIS — I739 Peripheral vascular disease, unspecified: Secondary | ICD-10-CM | POA: Diagnosis not present

## 2023-11-20 DIAGNOSIS — M109 Gout, unspecified: Secondary | ICD-10-CM | POA: Diagnosis not present

## 2023-11-28 ENCOUNTER — Encounter (HOSPITAL_COMMUNITY): Payer: Self-pay | Admitting: *Deleted

## 2023-12-04 DIAGNOSIS — L259 Unspecified contact dermatitis, unspecified cause: Secondary | ICD-10-CM | POA: Diagnosis not present

## 2023-12-04 DIAGNOSIS — S80821D Blister (nonthermal), right lower leg, subsequent encounter: Secondary | ICD-10-CM | POA: Diagnosis not present

## 2023-12-04 DIAGNOSIS — B351 Tinea unguium: Secondary | ICD-10-CM | POA: Diagnosis not present

## 2023-12-04 DIAGNOSIS — L97911 Non-pressure chronic ulcer of unspecified part of right lower leg limited to breakdown of skin: Secondary | ICD-10-CM | POA: Diagnosis not present

## 2023-12-04 DIAGNOSIS — E1140 Type 2 diabetes mellitus with diabetic neuropathy, unspecified: Secondary | ICD-10-CM | POA: Diagnosis not present

## 2023-12-04 DIAGNOSIS — I739 Peripheral vascular disease, unspecified: Secondary | ICD-10-CM | POA: Diagnosis not present

## 2023-12-04 DIAGNOSIS — M109 Gout, unspecified: Secondary | ICD-10-CM | POA: Diagnosis not present

## 2023-12-04 DIAGNOSIS — S80821A Blister (nonthermal), right lower leg, initial encounter: Secondary | ICD-10-CM | POA: Diagnosis not present

## 2023-12-04 DIAGNOSIS — L089 Local infection of the skin and subcutaneous tissue, unspecified: Secondary | ICD-10-CM | POA: Diagnosis not present

## 2023-12-11 ENCOUNTER — Other Ambulatory Visit: Payer: Self-pay | Admitting: Cardiology

## 2023-12-11 DIAGNOSIS — I493 Ventricular premature depolarization: Secondary | ICD-10-CM

## 2023-12-11 DIAGNOSIS — I4821 Permanent atrial fibrillation: Secondary | ICD-10-CM

## 2023-12-11 DIAGNOSIS — I2510 Atherosclerotic heart disease of native coronary artery without angina pectoris: Secondary | ICD-10-CM

## 2023-12-12 ENCOUNTER — Ambulatory Visit (HOSPITAL_COMMUNITY)
Admission: RE | Admit: 2023-12-12 | Discharge: 2023-12-12 | Disposition: A | Discharge status: Home / Self Care | Payer: Medicare Other | Source: Ambulatory Visit | Attending: Cardiology | Admitting: Cardiology

## 2023-12-12 DIAGNOSIS — I493 Ventricular premature depolarization: Secondary | ICD-10-CM | POA: Diagnosis not present

## 2023-12-12 DIAGNOSIS — I4821 Permanent atrial fibrillation: Secondary | ICD-10-CM | POA: Insufficient documentation

## 2023-12-12 DIAGNOSIS — I2510 Atherosclerotic heart disease of native coronary artery without angina pectoris: Secondary | ICD-10-CM | POA: Insufficient documentation

## 2023-12-12 LAB — MYOCARDIAL PERFUSION IMAGING
Base ST Depression (mm): 0 mm
LV dias vol: 157 mL (ref 62–150)
LV sys vol: 70 mL (ref 4.2–5.8)
Nuc Stress EF: 55 %
Peak HR: 91 {beats}/min
Rest HR: 64 {beats}/min
Rest Nuclear Isotope Dose: 13 mCi
SRS: 5
SSS: 3
ST Depression (mm): 0 mm
Stress Nuclear Isotope Dose: 37.2 mCi
TID: 1.18

## 2023-12-12 MED ORDER — TECHNETIUM TC 99M TETROFOSMIN IV KIT
13.0000 | PACK | Freq: Once | INTRAVENOUS | Status: AC | PRN
  Administered 2023-12-12: 13 via INTRAVENOUS

## 2023-12-12 MED ORDER — REGADENOSON 0.4 MG/5ML IV SOLN
INTRAVENOUS | Status: AC
Start: 2023-12-12 — End: 2023-12-12
  Filled 2023-12-12: qty 5

## 2023-12-12 MED ORDER — REGADENOSON 0.4 MG/5ML IV SOLN
0.4000 mg | Freq: Once | INTRAVENOUS | Status: AC
  Administered 2023-12-12: 0.4 mg via INTRAVENOUS

## 2023-12-12 MED ORDER — TECHNETIUM TC 99M TETROFOSMIN IV KIT
37.2000 | PACK | Freq: Once | INTRAVENOUS | Status: AC | PRN
  Administered 2023-12-12: 37.2 via INTRAVENOUS

## 2023-12-13 ENCOUNTER — Ambulatory Visit: Payer: Self-pay | Admitting: Cardiology

## 2023-12-13 ENCOUNTER — Ambulatory Visit (HOSPITAL_COMMUNITY): Payer: Medicare Other

## 2023-12-14 ENCOUNTER — Ambulatory Visit: Payer: Self-pay | Admitting: Cardiology

## 2023-12-17 ENCOUNTER — Other Ambulatory Visit: Payer: Self-pay | Admitting: Cardiology

## 2024-01-02 DIAGNOSIS — I739 Peripheral vascular disease, unspecified: Secondary | ICD-10-CM | POA: Diagnosis not present

## 2024-01-02 DIAGNOSIS — N182 Chronic kidney disease, stage 2 (mild): Secondary | ICD-10-CM | POA: Diagnosis not present

## 2024-01-02 DIAGNOSIS — E1165 Type 2 diabetes mellitus with hyperglycemia: Secondary | ICD-10-CM | POA: Diagnosis not present

## 2024-01-02 DIAGNOSIS — I4821 Permanent atrial fibrillation: Secondary | ICD-10-CM | POA: Diagnosis not present

## 2024-01-06 ENCOUNTER — Ambulatory Visit: Attending: Cardiology | Admitting: Cardiology

## 2024-01-06 VITALS — BP 118/60 | HR 64 | Ht 72.0 in | Wt 300.9 lb

## 2024-01-06 DIAGNOSIS — I493 Ventricular premature depolarization: Secondary | ICD-10-CM

## 2024-01-06 DIAGNOSIS — I4821 Permanent atrial fibrillation: Secondary | ICD-10-CM | POA: Diagnosis not present

## 2024-01-06 DIAGNOSIS — I2510 Atherosclerotic heart disease of native coronary artery without angina pectoris: Secondary | ICD-10-CM

## 2024-01-06 DIAGNOSIS — E785 Hyperlipidemia, unspecified: Secondary | ICD-10-CM | POA: Diagnosis not present

## 2024-01-06 DIAGNOSIS — Z951 Presence of aortocoronary bypass graft: Secondary | ICD-10-CM

## 2024-01-06 DIAGNOSIS — I872 Venous insufficiency (chronic) (peripheral): Secondary | ICD-10-CM

## 2024-01-06 DIAGNOSIS — I739 Peripheral vascular disease, unspecified: Secondary | ICD-10-CM

## 2024-01-06 DIAGNOSIS — Z6841 Body Mass Index (BMI) 40.0 and over, adult: Secondary | ICD-10-CM

## 2024-01-06 DIAGNOSIS — E1169 Type 2 diabetes mellitus with other specified complication: Secondary | ICD-10-CM

## 2024-01-06 DIAGNOSIS — I10 Essential (primary) hypertension: Secondary | ICD-10-CM

## 2024-01-06 MED ORDER — ATORVASTATIN CALCIUM 40 MG PO TABS: 40.0000 mg | ORAL_TABLET | Freq: Every day | ORAL | 3 refills | Status: AC

## 2024-01-06 MED ORDER — FUROSEMIDE 20 MG PO TABS: 20.0000 mg | ORAL_TABLET | Freq: Every day | ORAL | 3 refills | Status: AC | PRN

## 2024-01-06 NOTE — Patient Instructions (Addendum)
 Medication Instructions:   Lasix  20 mg  as needed up to 5 times a a day . The first week in use take 20 mg daily  then you can reduce to every other day or as needed   Reduce Atorvastatin   to 40 mg Daily ( 1/2 tablet)  until bottle is empty . A new prescription has been sent for 40 mg tablet daily    *If you need a refill on your cardiac medications before your next appointment, please call your pharmacy*   Lab Work: Not needed If you have labs (blood work) drawn today and your tests are completely normal, you will receive your results only by: MyChart Message (if you have MyChart) OR A paper copy in the mail If you have any lab test that is abnormal or we need to change your treatment, we will call you to review the results.   Testing/Procedures:  Not needed  Follow-Up: At [facility], you and your health needs are our priority.  As part of our continuing mission to provide you with exceptional heart care, we have created designated Provider Care Teams.  These Care Teams include your primary Cardiologist (physician) and Advanced Practice Providers (APPs -  Physician Assistants and Nurse Practitioners) who all work together to provide you with the care you need, when you need it.     Your next appointment:   12 month(s)  The format for your next appointment:   In Person  Provider:   Alm Clay, MD

## 2024-01-06 NOTE — Progress Notes (Signed)
 Cardiology Office Note:  .   Date:  01/13/2024  ID:  Carl Steele, DOB 12-01-1950, MRN [id number] PCP: Verdia Lombard, MD   [facility]Care Providers Cardiologist:  Alm Clay, MD     Chief Complaint  Patient presents with   Follow-up    1 year follow-up.  Doing well.  Has lost weight again.  Happy with lipids.   Coronary Artery Disease    Myoview  test results   Atrial Fibrillation    Cardiac unaware.    Patient Profile: .     Carl Steele is a morbidly obese 73 y.o. male with a PMH noted below who presents here for annual follow-up and to discuss test results.  At the request of Verdia Lombard, MD.  PMH : CAD-CABG, frequent PVCs, permanent A-fib, HTN and HLD as well as DM-2.  => He has been my patient since December 2011 when I first saw him for frequent PVCs, fatigue and exertional dyspnea.  Myoview  was abnormal due to frequent ectopy.  He initially declined cardiac catheterization but with continued symptoms we decided to proceed and found multivessel disease for which she underwent CABG.  He subsequently developed permanent A-fib.  Ilias Stcharles was last seen on 07/19/2021 for an annual follow-up and is doing very well.  He was thoroughly enjoying living in [place] .  Easily an ambulance get to his fishing.  But not really getting much exercise.  Going up down the steps to the fishing site was what he would do.  Had gotten out of the habit of going to the fitness center at the retirement community because he was being redone.  Had become more sedentary and gained some weight back.  Did not want to take any medications for lipids at that time worried about cost.  Mild end of day swelling.  Not really all that sensitive and being in A-fib.  No heart failure or angina symptoms.     Kortez Murtagh was last seen on January 02, 2023 for delayed follow-up.  He had lost some weight trying to adjust his diet.  No active cardiac symptoms.  He thought he had a potential  diabetes related ulcer at and underwent left leg stenting and right leg PTA.  With this, his diabetic ulcer was healing.  He was due for surveillance stress testing so we ordered a Myoview  stress test and he now presents for annual follow-up to discuss test results.  Subjective  Discussed the use of AI scribe software for clinical note transcription with the patient, who gave verbal consent to proceed.  History of Present Illness Carl Steele is a 73 year old male with multivessel coronary artery disease and permanent atrial fibrillation who presents for an annual follow-up and to discuss his recent stress test results.  He recently underwent a Lexiscan  stress test on August 28th, which showed apical septal hypokinesis consistent with apical septal infarction but no ischemia. He has a history of multivessel coronary artery disease and has previously undergone coronary artery bypass grafting. No chest pain, palpitations, or stroke-like symptoms. He experiences some swelling and has been using furosemide  20 mg every five to six days but plans to increase the frequency to every other day due to increased swelling.  He has experienced a decrease in physical activity since his last visit, contributing to weight gain, but is now driving to get his self back under control.  In doing so,he has managed to lose 16 pounds over the past year.  He has been taking Repatha since  March and stopped ezetimibe. His recent cholesterol check showed a significant decrease in LDL from 86 to 51, although he has only taken seven doses of Repatha in the last six months due to issues with receiving the correct medication form.  He has a history of permanent atrial fibrillation and is on Eliquis  for prophylaxis.   For diabetes management, he is on metformin 500 mg twice daily. His recent A1c was 7.0, slightly increased from previous readings of 6.8 and 6.7.  He resides in [place]  but travels to [place]  for  cardiology visits. He is retired and enjoys fishing, although he has become more sedentary recently. He plans to increase his physical activity upon returning to [place] .   Cardiovascular ROS: no chest pain or dyspnea on exertion positive for - exercise intolerance related to being overweight and deconditioned, also has edema that is pretty well-controlled if he adjust his Lasix  as noted.  No PND or orthopnea.  Occasional palpitations but rarely of concern negative for - orthopnea, paroxysmal nocturnal dyspnea, rapid heart rate, shortness of breath, or syncope or near syncope, TIA or amaurosis fugax, claudication.  No further melena, hematochezia or hematuria.  No further diabetes wounds.  ROS:  Review of Systems - he states he is actually been relatively healthy with the weight loss.    Objective   Current Meds  Medication Sig   ELIQUIS  5 MG TABS tablet TAKE 1 TABLET BY MOUTH TWICE  DAILY   metFORMIN (GLUCOPHAGE-XR) 500 MG 24 hr tablet Take 500 mg by mouth 2 (two) times daily.    metoprolol  succinate (TOPROL -XL) 100 MG 24 hr tablet TAKE 1 TABLET BY MOUTH DAILY  WITH OR IMMEDIATELY FOLLOWING A  MEAL   REPATHA 140 MG/ML SOSY    valsartan (DIOVAN) 80 MG tablet Take 80 mg by mouth every evening.   [DISCONTINUED] atorvastatin  (LIPITOR) 80 MG tablet Take 80 mg by mouth every evening.   [DISCONTINUED] furosemide  (LASIX ) 20 MG tablet TAKE 1 TABLET BY MOUTH DAILY AS  NEEDED      Studies Reviewed: .        Results LABS Total cholesterol: 110 (01/02/2024) Triglycerides: 76 (01/02/2024) HDL: 43 (01/02/2024) LDL: 51 (01/02/2024) Hemoglobin (Hb): 13.7 (01/02/2024) Platelets (PLT): 198 (01/02/2024) Glucose: 106 (01/02/2024) Hemoglobin A1c (A1c): 7 (01/02/2024) Creatinine (Cr): 0.85 (01/02/2024) Bilirubin: 1.4 (01/02/2024)  DIAGNOSTIC Lexiscan  Myoview :  LOW RISK.with apical septal hypokinesis consistent with apical septal infarction, no ischemia. Mildly dilated left ventricle with normal  function EF 55%.  (12/12/2023)   Risk Assessment/Calculations:    CHA2DS2-VASc Score = 5   This indicates a 7.2% annual risk of stroke. The patient's score is based upon: CHF History: 1 HTN History: 1 Diabetes History: 1 Stroke History: 0 Vascular Disease History: 1 Age Score: 1 Gender Score: 0          Physical Exam:   VS:  BP 118/60 (BP Location: Right Arm, Patient Position: Sitting)   Pulse 64   Ht 6' (1.829 m)   Wt (!) 300 lb 14.4 oz (136.5 kg)   SpO2 98%   BMI 40.81 kg/m    Wt Readings from Last 3 Encounters:  01/06/24 (!) 300 lb 14.4 oz (136.5 kg)  01/02/23 (!) 308 lb 3.2 oz (139.8 kg)  07/19/21 (!) 316 lb 12.8 oz (143.7 kg)      GEN: Well nourished, well groomed in no acute distress; morbidly obese but weight loss noted. NECK: No JVD; No carotid bruits CARDIAC: Irregularly irregular rhythm with normal rate.  Distant  heart sounds but mostly normal S1 and S2; high-pitched 1/6 SEM at LUSB but otherwise no rubs or gallops. RESPIRATORY:  Clear to auscultation without rales, wheezing or rhonchi ; nonlabored, good air movement. ABDOMEN: Soft, non-tender, non-distended EXTREMITIES: Trivial BLE edema; No deformity      ASSESSMENT AND PLAN: .    Problem List Items Addressed This Visit       Cardiology Problems   CAD, multiple vessel - Primary (Chronic)   Multivessel coronary artery disease with prior CABG and apical septal infarction. Recent stress test showed apical septal hypokinesis but no ischemia.  Doing well without any active anginal symptoms.  Starting to become more active.  Encouraged continued activity with dietary adjustment and weight loss. Continue Toprol  XL 100 mg daily along with valsartan 80 mg daily. Not on aspirin or Plavix due to use of Eliquis . Lipids well-controlled-reducing atorvastatin  to 40 mg daily along with Repatha and Zetia.      Relevant Medications   REPATHA 140 MG/ML SOSY   furosemide  (LASIX ) 20 MG tablet   atorvastatin   (LIPITOR) 40 MG tablet   Edema of lower extremity due to peripheral venous insufficiency (Chronic)   Chronic lower extremity edema with recent increase in swelling. Currently using furosemide  as needed, but requires more frequent dosing. - Prescribe furosemide  20 mg every other day as needed. - Consider taking furosemide  daily for the first week to reduce swelling. - Encourage elevation of legs and use of compression socks during the day.      Relevant Medications   REPATHA 140 MG/ML SOSY   furosemide  (LASIX ) 20 MG tablet   atorvastatin  (LIPITOR) 40 MG tablet   Essential hypertension (Chronic)   Hypertension managed with Toprol  and valsartan. Blood pressure well-controlled. - Continue Toprol  100 mg and valsartan 80 mg.      Relevant Medications   REPATHA 140 MG/ML SOSY   furosemide  (LASIX ) 20 MG tablet   atorvastatin  (LIPITOR) 40 MG tablet   Frequent unifocal PVCs (Chronic)   Seemingly pretty well-controlled on current dose of beta-blocker.  Continue Toprol  XL 100 mg daily.      Relevant Medications   REPATHA 140 MG/ML SOSY   furosemide  (LASIX ) 20 MG tablet   atorvastatin  (LIPITOR) 40 MG tablet   Hyperlipidemia associated with type 2 diabetes mellitus (HCC) (Chronic)   LDL cholesterol significantly reduced to 51 mg/dL with Repatha and atorvastatin . - Cut atorvastatin  dose in half to maintain antioxidant effects in the arteries.-Reduce from 80 mg atorvastatin  to 40 mg. - Continue Repatha.  140 mg biweekly injections. - Monitor cholesterol levels.  (Monitored by PCP)   Type 2 diabetes managed with metformin. Recent A1c increased to 7.0 from previous values of 6.8 and 6.7, indicating a slight worsening of glycemic control. - Continue metformin 500 mg twice daily. - Encourage dietary modifications to improve glycemic control.      Relevant Medications   REPATHA 140 MG/ML SOSY   furosemide  (LASIX ) 20 MG tablet   atorvastatin  (LIPITOR) 40 MG tablet   Permanent atrial  fibrillation (HCC); CHA2DS2-VASc Score = 4-> Eliquis  (Chronic)   For the most part well rate controlled and he is completely asymptomatic.  Would prefer to avoid side effect profile of antiarrhythmic agents.  Would likely not be a good candidate for ablation thankfully, his EF seems to have remained stable on recent testing. Managed with Eliquis  for stroke prophylaxis. No recent episodes of heart racing or palpitations reported. - Continue Eliquis  for stroke prophylaxis.   Plan: Continue rate control with  Toprol  XL 100 mg daily and CVA prophylaxis with Eliquis  5 mg twice daily.      Relevant Medications   REPATHA 140 MG/ML SOSY   furosemide  (LASIX ) 20 MG tablet   atorvastatin  (LIPITOR) 40 MG tablet     Other   Intermittent claudication (Chronic)   No further episodes of claudication or diabetic foot wounds.  Continue to monitor.      Morbid obesity with BMI of 40.0-44.9, adult (HCC) (Chronic)   Morbid obesity with recent weight loss of 16 pounds over the last year. - Encourage regular exercise and weight managemenI did broach the topic of considering GLP-1 agonist.  This may be difficult with his location.  May be best if he were to establish primary care locally.  - Encourage regular exercise and weight management.      S/P CABG x 4 (Chronic)   Stress test nonischemic.  Evidence of prior infarct with no ischemia.  Preserved EF. - No need for another stress test for 4-5 years.               Patient Instructions  Medication Instructions:   Lasix  20 mg  as needed up to 5 times a a day . The first week in use take 20 mg daily  then you can reduce to every other day or as needed   Reduce Atorvastatin   to 40 mg Daily ( 1/2 tablet)  until bottle is empty . A new prescription has been sent for 40 mg tablet daily    *If you need a refill on your cardiac medications before your next appointment, please call your pharmacy*   Lab Work: Not needed If you have labs (blood work)  drawn today and your tests are completely normal, you will receive your results only by: MyChart Message (if you have MyChart) OR A paper copy in the mail If you have any lab test that is abnormal or we need to change your treatment, we will call you to review the results.   Testing/Procedures:  Not needed   Follow-Up: Return in about 1 year (around 01/05/2025).  I spent 48 minutes in the care of Aeric Burnham today including reviewing outside labs from PCP via [facility] and scan report (2 minutes), reviewing studies (since reviewed and compared to previous studies as well as Films-5 minutes), face to face time discussing treatment options (26 minutes), reviewing records from previous notes and PCP (4 minutes), 11 minutes dictating, and documenting in the encounter.      Signed, Alm MICAEL Clay, MD, MS Alm Clay, M.D., M.S. Interventional Cardiologist  [facility] Pager # [phone]

## 2024-01-09 DIAGNOSIS — R600 Localized edema: Secondary | ICD-10-CM | POA: Diagnosis not present

## 2024-01-09 DIAGNOSIS — Z0000 Encounter for general adult medical examination without abnormal findings: Secondary | ICD-10-CM | POA: Diagnosis not present

## 2024-01-09 DIAGNOSIS — I2510 Atherosclerotic heart disease of native coronary artery without angina pectoris: Secondary | ICD-10-CM | POA: Diagnosis not present

## 2024-01-09 DIAGNOSIS — N182 Chronic kidney disease, stage 2 (mild): Secondary | ICD-10-CM | POA: Diagnosis not present

## 2024-01-09 DIAGNOSIS — E782 Mixed hyperlipidemia: Secondary | ICD-10-CM | POA: Diagnosis not present

## 2024-01-09 DIAGNOSIS — D692 Other nonthrombocytopenic purpura: Secondary | ICD-10-CM | POA: Diagnosis not present

## 2024-01-09 DIAGNOSIS — I739 Peripheral vascular disease, unspecified: Secondary | ICD-10-CM | POA: Diagnosis not present

## 2024-01-09 DIAGNOSIS — I4821 Permanent atrial fibrillation: Secondary | ICD-10-CM | POA: Diagnosis not present

## 2024-01-09 DIAGNOSIS — E1165 Type 2 diabetes mellitus with hyperglycemia: Secondary | ICD-10-CM | POA: Diagnosis not present

## 2024-01-09 DIAGNOSIS — I10 Essential (primary) hypertension: Secondary | ICD-10-CM | POA: Diagnosis not present

## 2024-01-13 ENCOUNTER — Encounter: Payer: Self-pay | Admitting: Cardiology

## 2024-01-13 DIAGNOSIS — I872 Venous insufficiency (chronic) (peripheral): Secondary | ICD-10-CM | POA: Insufficient documentation

## 2024-01-13 NOTE — Assessment & Plan Note (Signed)
 No further episodes of claudication or diabetic foot wounds.  Continue to monitor.

## 2024-01-13 NOTE — Assessment & Plan Note (Signed)
 Seemingly pretty well-controlled on current dose of beta-blocker.  Continue Toprol  XL 100 mg daily.

## 2024-01-13 NOTE — Assessment & Plan Note (Addendum)
 Morbid obesity with recent weight loss of 16 pounds over the last year. - Encourage regular exercise and weight managemenI did broach the topic of considering GLP-1 agonist.  This may be difficult with his location.  May be best if he were to establish primary care locally.  - Encourage regular exercise and weight management.

## 2024-01-13 NOTE — Assessment & Plan Note (Signed)
 Hypertension managed with Toprol  and valsartan. Blood pressure well-controlled. - Continue Toprol  100 mg and valsartan 80 mg.

## 2024-01-13 NOTE — Assessment & Plan Note (Addendum)
 Multivessel coronary artery disease with prior CABG and apical septal infarction. Recent stress test showed apical septal hypokinesis but no ischemia.  Doing well without any active anginal symptoms.  Starting to become more active.  Encouraged continued activity with dietary adjustment and weight loss. Continue Toprol  XL 100 mg daily along with valsartan 80 mg daily. Not on aspirin or Plavix due to use of Eliquis . Lipids well-controlled-reducing atorvastatin  to 40 mg daily along with Repatha and Zetia.

## 2024-01-13 NOTE — Assessment & Plan Note (Signed)
 Stress test nonischemic.  Evidence of prior infarct with no ischemia.  Preserved EF. - No need for another stress test for 4-5 years.

## 2024-01-13 NOTE — Assessment & Plan Note (Signed)
 LDL cholesterol significantly reduced to 51 mg/dL with Repatha and atorvastatin . - Cut atorvastatin  dose in half to maintain antioxidant effects in the arteries.-Reduce from 80 mg atorvastatin  to 40 mg. - Continue Repatha.  140 mg biweekly injections. - Monitor cholesterol levels.  (Monitored by PCP)   Type 2 diabetes managed with metformin. Recent A1c increased to 7.0 from previous values of 6.8 and 6.7, indicating a slight worsening of glycemic control. - Continue metformin 500 mg twice daily. - Encourage dietary modifications to improve glycemic control.

## 2024-01-13 NOTE — Assessment & Plan Note (Signed)
 Chronic lower extremity edema with recent increase in swelling. Currently using furosemide  as needed, but requires more frequent dosing. - Prescribe furosemide  20 mg every other day as needed. - Consider taking furosemide  daily for the first week to reduce swelling. - Encourage elevation of legs and use of compression socks during the day.

## 2024-01-13 NOTE — Assessment & Plan Note (Addendum)
 For the most part well rate controlled and he is completely asymptomatic.  Would prefer to avoid side effect profile of antiarrhythmic agents.  Would likely not be a good candidate for ablation thankfully, his EF seems to have remained stable on recent testing. Managed with Eliquis  for stroke prophylaxis. No recent episodes of heart racing or palpitations reported. - Continue Eliquis  for stroke prophylaxis.   Plan: Continue rate control with Toprol  XL 100 mg daily and CVA prophylaxis with Eliquis  5 mg twice daily.

## 2024-01-18 ENCOUNTER — Other Ambulatory Visit: Payer: Self-pay | Admitting: Cardiology

## 2024-01-20 NOTE — Telephone Encounter (Signed)
 Prescription refill request for Eliquis  received. Indication:afib Last office visit:9/25 Scr:1.07  4/25 Age: 73 Weight:136.5  kg  Prescription refilled

## 2024-01-22 DIAGNOSIS — L97911 Non-pressure chronic ulcer of unspecified part of right lower leg limited to breakdown of skin: Secondary | ICD-10-CM | POA: Diagnosis not present

## 2024-01-22 DIAGNOSIS — L259 Unspecified contact dermatitis, unspecified cause: Secondary | ICD-10-CM | POA: Diagnosis not present

## 2024-01-22 DIAGNOSIS — I739 Peripheral vascular disease, unspecified: Secondary | ICD-10-CM | POA: Diagnosis not present

## 2024-01-22 DIAGNOSIS — E1140 Type 2 diabetes mellitus with diabetic neuropathy, unspecified: Secondary | ICD-10-CM | POA: Diagnosis not present

## 2024-01-22 DIAGNOSIS — L089 Local infection of the skin and subcutaneous tissue, unspecified: Secondary | ICD-10-CM | POA: Diagnosis not present

## 2024-01-22 DIAGNOSIS — M109 Gout, unspecified: Secondary | ICD-10-CM | POA: Diagnosis not present

## 2024-01-22 DIAGNOSIS — B351 Tinea unguium: Secondary | ICD-10-CM | POA: Diagnosis not present

## 2024-01-22 DIAGNOSIS — S80821A Blister (nonthermal), right lower leg, initial encounter: Secondary | ICD-10-CM | POA: Diagnosis not present
